# Patient Record
Sex: Female | Born: 1950 | Race: White | Hispanic: No | Smoking: Never smoker
Health system: Southern US, Community
[De-identification: ages and names within clinical notes are randomized; demographics above are authoritative.]

## PROBLEM LIST (undated history)

## (undated) DIAGNOSIS — Z860100 Personal history of colon polyps, unspecified: Secondary | ICD-10-CM

## (undated) DIAGNOSIS — I10 Essential (primary) hypertension: Secondary | ICD-10-CM

## (undated) DIAGNOSIS — L4050 Arthropathic psoriasis, unspecified: Secondary | ICD-10-CM

## (undated) DIAGNOSIS — K219 Gastro-esophageal reflux disease without esophagitis: Secondary | ICD-10-CM

## (undated) DIAGNOSIS — L0390 Cellulitis, unspecified: Secondary | ICD-10-CM

## (undated) DIAGNOSIS — J45909 Unspecified asthma, uncomplicated: Secondary | ICD-10-CM

## (undated) DIAGNOSIS — K8020 Calculus of gallbladder without cholecystitis without obstruction: Secondary | ICD-10-CM

## (undated) DIAGNOSIS — E669 Obesity, unspecified: Secondary | ICD-10-CM

## (undated) DIAGNOSIS — K922 Gastrointestinal hemorrhage, unspecified: Secondary | ICD-10-CM

## (undated) DIAGNOSIS — E119 Type 2 diabetes mellitus without complications: Secondary | ICD-10-CM

## (undated) DIAGNOSIS — N200 Calculus of kidney: Secondary | ICD-10-CM

## (undated) DIAGNOSIS — Z9889 Other specified postprocedural states: Secondary | ICD-10-CM

## (undated) DIAGNOSIS — K7581 Nonalcoholic steatohepatitis (NASH): Secondary | ICD-10-CM

## (undated) HISTORY — DX: Essential (primary) hypertension: I10

## (undated) HISTORY — DX: Type 2 diabetes mellitus without complications: E11.9

## (undated) HISTORY — DX: Obesity, unspecified: E66.9

## (undated) HISTORY — PX: CARDIAC CATHETERIZATION: SHX172

## (undated) HISTORY — PX: KIDNEY STONE SURGERY: SHX686

## (undated) HISTORY — DX: Unspecified asthma, uncomplicated: J45.909

## (undated) HISTORY — DX: Arthropathic psoriasis, unspecified: L40.50

## (undated) HISTORY — DX: Calculus of kidney: N20.0

## (undated) HISTORY — PX: THYROGLOSSAL DUCT CYST: SHX297

## (undated) HISTORY — DX: Cellulitis, unspecified: L03.90

## (undated) HISTORY — DX: Other specified postprocedural states: Z98.890

## (undated) HISTORY — PX: COLONOSCOPY: SHX174

## (undated) HISTORY — DX: Gastrointestinal hemorrhage, unspecified: K92.2

## (undated) HISTORY — DX: Calculus of gallbladder without cholecystitis without obstruction: K80.20

## (undated) HISTORY — PX: CHOLECYSTECTOMY: SHX55

## (undated) HISTORY — PX: TONSILLECTOMY: SUR1361

## (undated) HISTORY — DX: Personal history of colonic polyps: Z86.010

## (undated) HISTORY — DX: Nonalcoholic steatohepatitis (NASH): K75.81

## (undated) HISTORY — DX: Personal history of colon polyps, unspecified: Z86.0100

## (undated) HISTORY — PX: BREAST LUMPECTOMY: SHX2

## (undated) HISTORY — PX: ABDOMINAL HYSTERECTOMY: SHX81

## (undated) HISTORY — DX: Gastro-esophageal reflux disease without esophagitis: K21.9

## (undated) HISTORY — PX: KNEE ARTHROSCOPY: SUR90

---

## 1988-07-18 HISTORY — PX: BREAST EXCISIONAL BIOPSY: SUR124

## 1998-05-05 ENCOUNTER — Ambulatory Visit (HOSPITAL_COMMUNITY): Admission: RE | Admit: 1998-05-05 | Discharge: 1998-05-05 | Payer: Self-pay | Admitting: Endocrinology

## 1999-06-16 ENCOUNTER — Encounter: Payer: Self-pay | Admitting: Family Medicine

## 1999-06-16 ENCOUNTER — Encounter: Admission: RE | Admit: 1999-06-16 | Discharge: 1999-06-16 | Payer: Self-pay | Admitting: Family Medicine

## 2000-09-22 ENCOUNTER — Other Ambulatory Visit: Admission: RE | Admit: 2000-09-22 | Discharge: 2000-09-22 | Payer: Self-pay | Admitting: Surgery

## 2001-08-15 ENCOUNTER — Encounter: Payer: Self-pay | Admitting: Interventional Cardiology

## 2001-08-15 ENCOUNTER — Encounter: Admission: RE | Admit: 2001-08-15 | Discharge: 2001-08-15 | Payer: Self-pay | Admitting: Interventional Cardiology

## 2001-08-16 ENCOUNTER — Ambulatory Visit (HOSPITAL_COMMUNITY): Admission: RE | Admit: 2001-08-16 | Discharge: 2001-08-16 | Payer: Self-pay | Admitting: Interventional Cardiology

## 2003-10-16 ENCOUNTER — Other Ambulatory Visit: Admission: RE | Admit: 2003-10-16 | Discharge: 2003-10-16 | Payer: Self-pay | Admitting: Obstetrics and Gynecology

## 2007-04-06 ENCOUNTER — Encounter (INDEPENDENT_AMBULATORY_CARE_PROVIDER_SITE_OTHER): Payer: Self-pay | Admitting: Surgery

## 2007-04-06 ENCOUNTER — Ambulatory Visit (HOSPITAL_COMMUNITY): Admission: RE | Admit: 2007-04-06 | Discharge: 2007-04-06 | Payer: Self-pay | Admitting: Surgery

## 2008-01-07 ENCOUNTER — Encounter: Admission: RE | Admit: 2008-01-07 | Discharge: 2008-01-07 | Payer: Self-pay | Admitting: Rheumatology

## 2008-01-13 ENCOUNTER — Emergency Department (HOSPITAL_COMMUNITY): Admission: EM | Admit: 2008-01-13 | Discharge: 2008-01-14 | Payer: Self-pay | Admitting: Emergency Medicine

## 2008-02-27 ENCOUNTER — Encounter: Admission: RE | Admit: 2008-02-27 | Discharge: 2008-02-27 | Payer: Self-pay | Admitting: Gastroenterology

## 2008-12-17 ENCOUNTER — Ambulatory Visit (HOSPITAL_BASED_OUTPATIENT_CLINIC_OR_DEPARTMENT_OTHER): Admission: RE | Admit: 2008-12-17 | Discharge: 2008-12-17 | Payer: Self-pay | Admitting: Orthopedic Surgery

## 2009-04-19 DIAGNOSIS — E785 Hyperlipidemia, unspecified: Secondary | ICD-10-CM | POA: Insufficient documentation

## 2009-04-19 DIAGNOSIS — I2510 Atherosclerotic heart disease of native coronary artery without angina pectoris: Secondary | ICD-10-CM | POA: Insufficient documentation

## 2009-04-19 DIAGNOSIS — E042 Nontoxic multinodular goiter: Secondary | ICD-10-CM | POA: Insufficient documentation

## 2009-04-19 DIAGNOSIS — K635 Polyp of colon: Secondary | ICD-10-CM | POA: Insufficient documentation

## 2009-04-19 DIAGNOSIS — L4050 Arthropathic psoriasis, unspecified: Secondary | ICD-10-CM | POA: Insufficient documentation

## 2009-04-19 DIAGNOSIS — J45909 Unspecified asthma, uncomplicated: Secondary | ICD-10-CM | POA: Insufficient documentation

## 2009-04-19 DIAGNOSIS — F320 Major depressive disorder, single episode, mild: Secondary | ICD-10-CM | POA: Insufficient documentation

## 2009-07-03 ENCOUNTER — Encounter: Admission: RE | Admit: 2009-07-03 | Discharge: 2009-07-03 | Payer: Self-pay | Admitting: Obstetrics and Gynecology

## 2009-11-03 ENCOUNTER — Other Ambulatory Visit: Admission: RE | Admit: 2009-11-03 | Discharge: 2009-11-03 | Payer: Self-pay | Admitting: Obstetrics and Gynecology

## 2009-11-05 ENCOUNTER — Encounter: Admission: RE | Admit: 2009-11-05 | Discharge: 2009-11-05 | Payer: Self-pay | Admitting: Surgery

## 2010-02-02 DIAGNOSIS — E559 Vitamin D deficiency, unspecified: Secondary | ICD-10-CM | POA: Insufficient documentation

## 2010-03-04 ENCOUNTER — Encounter
Admission: RE | Admit: 2010-03-04 | Discharge: 2010-04-15 | Payer: Self-pay | Source: Home / Self Care | Admitting: Surgery

## 2010-08-08 ENCOUNTER — Encounter: Payer: Self-pay | Admitting: Surgery

## 2010-10-25 LAB — POCT I-STAT 4, (NA,K, GLUC, HGB,HCT)
HCT: 42 % (ref 36.0–46.0)
Hemoglobin: 14.3 g/dL (ref 12.0–15.0)

## 2010-10-26 DIAGNOSIS — M109 Gout, unspecified: Secondary | ICD-10-CM | POA: Insufficient documentation

## 2010-10-28 ENCOUNTER — Other Ambulatory Visit: Payer: Self-pay | Admitting: Endocrinology

## 2010-10-28 DIAGNOSIS — Z1231 Encounter for screening mammogram for malignant neoplasm of breast: Secondary | ICD-10-CM

## 2010-11-15 ENCOUNTER — Ambulatory Visit
Admission: RE | Admit: 2010-11-15 | Discharge: 2010-11-15 | Disposition: A | Discharge status: Home / Self Care | Payer: PRIVATE HEALTH INSURANCE | Source: Ambulatory Visit | Attending: Endocrinology | Admitting: Endocrinology

## 2010-11-15 DIAGNOSIS — Z1231 Encounter for screening mammogram for malignant neoplasm of breast: Secondary | ICD-10-CM

## 2010-11-30 NOTE — Op Note (Signed)
Connie Shelton, NAVEDO NO.:  [id number]   MEDICAL RECORD NO.:  [id number]          PATIENT TYPE:  AMB   LOCATION:  [facility]                         FACILITY:  [facility]   PHYSICIAN:  Ollen Gross, M.D.    DATE OF BIRTH:  1950-07-30   DATE OF PROCEDURE:  12/17/2008  DATE OF DISCHARGE:                               OPERATIVE REPORT   PREOPERATIVE DIAGNOSIS:  Left knee chondral defect and meniscal tear.   POSTOPERATIVE DIAGNOSIS:  Left knee chondral defect plus loose bodies.   PROCEDURE:  Left knee arthroscopy with chondroplasty medial and lateral  and removal of several large loose bodies.   SURGEON:  Ollen Gross, MD   ASSISTANT.:  None.   ANESTHESIA:  General.   ESTIMATED BLOOD LOSS:  Minimal.   DRAINS:  None.   COMPLICATIONS:  None.   CONDITION:  Stable to recovery.   BRIEF CLINICAL NOTE:  Connie Shelton is a 60 year old female who had an on-the-  job injury over a month ago, injuring her left knee.  She has had  significant pain and mechanical symptoms.  She had a preexisting  arthritis but the condition has worsened with regards to her symptoms.  She presents now for arthroscopy and debridement.   PROCEDURE IN DETAIL:  After successful initiation of general anesthetic,  tourniquet was placed on the left thigh and left lower extremity prepped  and draped in the usual sterile fashion.  Standard superomedial and  inferolateral incisions were made.  Inflow cannula passed superomedial  and camera passed inferolateral.  Arthroscopic visualization proceeds.  Undersurface of patella and trochlea had some grade 2 changes.  There is  1 unstable cartilage lesion on the trochlea which is a very small lesion  about 5 x 5 mm.  The suprapatellar area shows some synovitis but no  loose bodies.  In the lateral gutter, there was a large loose body.  Medial gutter is visualized.  There are no loose bodies there.  Flexion  and valgus force applied to the knee and the medial  compartment is  entered.  There is evidence of a large chondral defect on the medial  femoral condyle about 2 x 3 cm.  The meniscus is intact.  A spinal  needle was used to localize the inferomedial portal, small incision  made, dilator placed and then we were able to get that loose body from  the lateral gutter and get it to pass down to the intercondylar area.  It stayed there while I addressed the medial compartment.  A shaver was  used to debride the chondral defect down to a stable bony base with  stable cartilaginous edges.  Then I abraded the bone to get to a  bleeding surface for hopeful chondral regeneration for fibrocartilage.  The rest of the medial compartment looked okay.  We then addressed the  intercondylar notch.  I had to make the incision a little larger on the  inferomedial portal and then placed a grasping pituitary through that  portal and grabbed the loose body.  I was able to pull it through and  exit  through the portal.  The size was about 1 x 1.5 cm.  This was a  very large cartilaginous loose body.  I then visualized the  intercondylar notch and the ACL was normal.  The lateral compartment was  entered and there was about a 5 x 5-mm osteochondral defect on the  lateral tibial plateau.  There was some degeneration on the lateral  femoral condyle which looked more chronic.  I removed the osteochondral  loose piece with pituitary grasper and then debrided that back down to a  stable base with the shaver.  The lateral meniscus looked okay.  I again  looked throughout the entire joint.  There were no other loose bodies  noted.  I debrided that unstable cartilage lesion on the trochlea.  We  then used the ArthroCare to debride some of the hypertrophic synovium  through the joint.  I again inspected, and no other loose bodies, tears  or defects noted.  The arthroscopic equipment was then removed from the  inferior portals, which were closed with interrupted 4-0 nylon; 20  cc of  0.25% Marcaine with epi are injected through the inflow cannula, and  that is removed, and that portal closed with nylon.  A bulky sterile  dressing is applied.  She is awakened and transported to recovery in  stable condition.      Ollen Gross, M.D.  Electronically Signed     FA/MEDQ  D:  12/17/2008  T:  12/18/2008  Job:  [id number]

## 2010-11-30 NOTE — Op Note (Signed)
Connie Shelton, GRAFFIUS                 ACCOUNT NO.:  [id number]   MEDICAL RECORD NO.:  [id number]          PATIENT TYPE:  AMB   LOCATION:  DAY                          FACILITY:  [facility]   PHYSICIAN:  Thornton Park. Daphine Deutscher, MD  DATE OF BIRTH:  February 04, 1951   DATE OF PROCEDURE:  04/06/2007  DATE OF DISCHARGE:                               OPERATIVE REPORT   PREOPERATIVE DIAGNOSES:  1. Morbid obesity, body mass index 44  2. Chronic cholecystitis.  Marland Kitchen   POSTOPERATIVE DIAGNOSES:  1. Morbid obesity.  2. Chronic cholecystitis.  3. Nonalcoholic steatohepatitis with marked hepatic enlargement.   PROCEDURE:  Laparoscopic cholecystectomy with intraoperative  cholangiogram and liver biopsy; this was in nonstandard fashion from the  fundus down and because of the marked intrahepatic nature of the  gallbladder and also the fact that she had a markedly fatty-infiltrated  liver.   SURGEON:  Thornton Park. Daphine Deutscher, MD   ASSISTANT:  Lennie Muckle, MD   DESCRIPTION OF PROCEDURE:  Connie Shelton was taken to room #1 on April 06, 2007 and given general anesthesia.  The abdomen was prepped with  Techni-Care and draped sterilely.  I made a longitudinal incision  initially down into her umbilicus, where she had had a previous incision  for a laparoscopy.  Because of her size and I did not feel comfortable  with the way this was going into the fascia, I went ahead and changed my  approach and went up to her right upper quadrant and put a 5-mm OptiVu  in using a 0-degree scope.  This entered without difficulty and then I  inflated the abdomen and then inserted the scope and saw adhesions all  along the midline.  A second 5-mm was placed on the left side and these  adhesions were taken down with sharp dissection.  I then decided to put  a 10/11 up from the umbilicus instead of going down that low and I do  and then we converted to a 30-degree 10-mm scope.  Another 10 mm placed  in the upper midabdomen in the  midline.   The gallbladder was grasped with some difficulty; it was found to be  markedly intrahepatic and the liver was so steatohepatitis that it was  very difficult to retract or even lift up.  The angled scope, I spent a  long time just trying to dissect free Calot's triangle and then  basically we made a very deliberate effort.  The duodenum was stuck up  to the infundibulum and after some degree of dissection and after not  making any better progress in exposing that area, I elected to go ahead  and take it from the fundus down.  I elevated the liver with a Glassman  and then incised it with a hook.  I was able to get the gallbladder down  to the neck without entering it.  I then incised it after putting a clip  upon it and did a dynamic cholangiogram, using the balloon catheter to  hold it in place and clip.  Cholangiogram showed a very scanty little  common bile duct, a long cystic duct with good intrahepatic filling and  free flow in the duodenum.  The long cystic duct was then triple-clipped  and divided and the gallbladder was removed.  No bleeding or bile leaks  were noted from the gallbladder bed.  This area was irrigated and  reinspected.  It was placed in a bag and brought through the upper  abdominal trocar.  No other bleeding or abnormalities were noted and the  port sites were injected with Marcaine again, the abdomen was deflated  and the incisions were all closed with 4-0 Vicryl with Benzoin and Steri-  Strips.  The patient seemed to tolerate the procedure well.  Prior to  closure, I did take 3 little core  liver biopsies, again because of her apparent NASH, and these were not  bleeding as well.  The patient tolerated the procedure well and was  taken to the recovery room.  She will be given Tylox to take if needed  for pain and will be followed up the office in about 3 weeks.      Thornton Park Daphine Deutscher, MD  Electronically Signed     MBM/MEDQ  D:  04/06/2007  T:   04/07/2007  Job:  [id number]   cc:   Tera Mater. Evlyn Kanner, M.D.  Fax: [phone]

## 2010-12-03 NOTE — Cardiovascular Report (Signed)
[place]  Patient:    Connie Shelton, Connie Shelton Visit Number: [id number] MRN: [id number]          Service Type: CAT Location: [facility] 2852 01 Attending Physician:  Lyn Records. Iii Dictated by:   Darci Needle, M.D. Proc. Date: 08/16/01 Admit Date:  08/16/2001   CC:         Jeannett Senior A. Evlyn Kanner, M.D.  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  She has had previous cardiac catheterizations that have not demonstrated significant obstructive disease.  This study is being done in the midst of increasing anginal symptoms responsive to nitroglycerin.  PROCEDURES PERFORMED: 1. Left heart catheterization. 2. Selective coronary angiography. 3. Left ventriculography.  DESCRIPTION OF PROCEDURE:  After informed consent, a 6-French sheath was inserted into the right femoral artery.  The patient received 4 mg of IV Versed in 2 mg aliquots to achieve conscious sedation.  Angiography was then performed initially using an A2 6-French multipurpose catheter.  Left ventriculography and hemodynamic recordings were made with this catheter and right coronary angiography was also performed using the multipurpose.  A 6-French #2 left Judkins catheter was used for left coronary angiography. Intracoronary nitroglycerin 100 mg was administered into the left coronary without complications.  RESULTS:    I. HEMODYNAMIC DATA:          a. Aortic pressure 128/71.          b. Left ventricular pressure 137/17.   II. LEFT VENTRICULOGRAPHY:  The left ventricle cavity size is normal.      Contractility is normal.  Ejection fraction is 65%.  III. SELECTIVE CORONARY ANGIOGRAPHY:          a. Left main coronary artery:  Long and free of any obstruction.          b. Left anterior descending coronary:   The LAD is large and reaches             the left ventricular apex.  It gives origin to two large diagonal             branches.  Distal to the second diagonal, there is an eccentric             plaque obstructing the vessel by up to 25%.  No significant             obstruction is noted in the LAD.  More minimal intimal             irregularities are noted in the mid vessel between the first and             second diagonals.          c. Circumflex:  The circumflex is relatively small in its             distribution.  The first and second obtuse marginals are small.             The third obtuse marginal trifurcates on the inferolateral wall.             No significant obstruction is noted.          d. Right coronary artery:  The right coronary artery is large, free             of intimal plaquing, and supplies the  inferior and the lateral             wall.  CONCLUSIONS: 1. Essentially normal coronary arteries.  There are intimal irregularities    noted in the mid left anterior descending artery and a 25% plaque distal to    the second diagonal in the left anterior descending artery.  Irregularities    are also noted in the circumflex.  No significant obstructive lesions are    seen. 2. Normal left ventricular function.  RECOMMENDATIONS:  Aggressive risk factor modification.  Certainly no opportunity for intervention.  Consider checking high sensitivity C-reactive protein. Dictated by:   Darci Needle, M.D. Attending Physician:  Lyn Records. Iii DD:  08/16/01 TD:  08/16/01 Job: [id number]

## 2011-03-09 DIAGNOSIS — E669 Obesity, unspecified: Secondary | ICD-10-CM

## 2011-03-23 DIAGNOSIS — E669 Obesity, unspecified: Secondary | ICD-10-CM

## 2011-03-30 DIAGNOSIS — E669 Obesity, unspecified: Secondary | ICD-10-CM

## 2011-04-14 LAB — POCT I-STAT, CHEM 8
BUN: 8
Chloride: 103
Potassium: 4
Sodium: 136
TCO2: 25

## 2011-04-14 LAB — DIFFERENTIAL
Lymphocytes Relative: 30
Lymphs Abs: 3.1
Monocytes Absolute: 0.6
Monocytes Relative: 5
Neutro Abs: 6.8
Neutrophils Relative %: 65

## 2011-04-14 LAB — CBC
Hemoglobin: 14.5
RBC: 4.62
WBC: 10.5

## 2011-04-14 LAB — POCT CARDIAC MARKERS
CKMB, poc: 1.1
Myoglobin, poc: 40.2
Operator id: 277751

## 2011-04-14 LAB — PROTIME-INR: INR: 0.9

## 2011-04-29 LAB — BASIC METABOLIC PANEL
BUN: 12
CO2: 26
Calcium: 9.1
Chloride: 104
Creatinine, Ser: 0.58

## 2011-04-29 LAB — URINALYSIS, ROUTINE W REFLEX MICROSCOPIC
Glucose, UA: NEGATIVE
Ketones, ur: NEGATIVE
Protein, ur: NEGATIVE
Urobilinogen, UA: 0.2

## 2011-04-29 LAB — HEMOGLOBIN AND HEMATOCRIT, BLOOD: Hemoglobin: 13.7

## 2011-07-04 DIAGNOSIS — E113599 Type 2 diabetes mellitus with proliferative diabetic retinopathy without macular edema, unspecified eye: Secondary | ICD-10-CM | POA: Insufficient documentation

## 2012-05-21 ENCOUNTER — Other Ambulatory Visit: Payer: Self-pay | Admitting: Endocrinology

## 2012-05-21 DIAGNOSIS — Z1231 Encounter for screening mammogram for malignant neoplasm of breast: Secondary | ICD-10-CM

## 2012-06-28 ENCOUNTER — Ambulatory Visit: Payer: PRIVATE HEALTH INSURANCE

## 2012-07-31 ENCOUNTER — Ambulatory Visit
Admission: RE | Admit: 2012-07-31 | Discharge: 2012-07-31 | Disposition: A | Discharge status: Home / Self Care | Payer: Self-pay | Source: Ambulatory Visit | Attending: Endocrinology | Admitting: Endocrinology

## 2012-07-31 DIAGNOSIS — Z1231 Encounter for screening mammogram for malignant neoplasm of breast: Secondary | ICD-10-CM

## 2012-08-08 ENCOUNTER — Other Ambulatory Visit: Payer: Self-pay | Admitting: Endocrinology

## 2012-08-08 DIAGNOSIS — R928 Other abnormal and inconclusive findings on diagnostic imaging of breast: Secondary | ICD-10-CM

## 2012-08-20 ENCOUNTER — Ambulatory Visit
Admission: RE | Admit: 2012-08-20 | Discharge: 2012-08-20 | Disposition: A | Discharge status: Home / Self Care | Payer: BC Managed Care – PPO | Source: Ambulatory Visit | Attending: Endocrinology | Admitting: Endocrinology

## 2012-08-20 DIAGNOSIS — R928 Other abnormal and inconclusive findings on diagnostic imaging of breast: Secondary | ICD-10-CM

## 2013-02-18 ENCOUNTER — Other Ambulatory Visit: Payer: Self-pay | Admitting: Endocrinology

## 2013-02-18 DIAGNOSIS — N6310 Unspecified lump in the right breast, unspecified quadrant: Secondary | ICD-10-CM

## 2013-03-04 ENCOUNTER — Ambulatory Visit
Admission: RE | Admit: 2013-03-04 | Discharge: 2013-03-04 | Disposition: A | Discharge status: Home / Self Care | Payer: BC Managed Care – PPO | Source: Ambulatory Visit | Attending: Endocrinology | Admitting: Endocrinology

## 2013-03-04 DIAGNOSIS — N6310 Unspecified lump in the right breast, unspecified quadrant: Secondary | ICD-10-CM

## 2013-03-12 ENCOUNTER — Ambulatory Visit
Admission: RE | Admit: 2013-03-12 | Discharge: 2013-03-12 | Disposition: A | Discharge status: Home / Self Care | Payer: BC Managed Care – PPO | Source: Ambulatory Visit | Attending: Dermatology | Admitting: Dermatology

## 2013-03-12 ENCOUNTER — Other Ambulatory Visit: Payer: Self-pay | Admitting: Dermatology

## 2013-03-12 DIAGNOSIS — M79604 Pain in right leg: Secondary | ICD-10-CM

## 2013-10-15 ENCOUNTER — Other Ambulatory Visit: Payer: Self-pay | Admitting: Endocrinology

## 2013-10-15 DIAGNOSIS — N63 Unspecified lump in unspecified breast: Secondary | ICD-10-CM

## 2013-10-22 ENCOUNTER — Ambulatory Visit
Admission: RE | Admit: 2013-10-22 | Discharge: 2013-10-22 | Disposition: A | Discharge status: Home / Self Care | Payer: Self-pay | Source: Ambulatory Visit | Attending: Endocrinology | Admitting: Endocrinology

## 2013-10-22 DIAGNOSIS — N63 Unspecified lump in unspecified breast: Secondary | ICD-10-CM

## 2014-01-06 ENCOUNTER — Ambulatory Visit: Payer: BC Managed Care – PPO | Admitting: Interventional Cardiology

## 2014-01-13 ENCOUNTER — Encounter: Payer: Self-pay | Admitting: *Deleted

## 2014-02-18 ENCOUNTER — Ambulatory Visit: Payer: BC Managed Care – PPO | Admitting: Interventional Cardiology

## 2014-04-24 ENCOUNTER — Ambulatory Visit: Payer: BC Managed Care – PPO | Admitting: Interventional Cardiology

## 2014-05-30 ENCOUNTER — Encounter: Payer: Self-pay | Admitting: Interventional Cardiology

## 2014-06-27 ENCOUNTER — Ambulatory Visit: Payer: BC Managed Care – PPO | Admitting: Interventional Cardiology

## 2014-08-07 ENCOUNTER — Ambulatory Visit: Payer: BC Managed Care – PPO | Admitting: Interventional Cardiology

## 2014-11-11 ENCOUNTER — Ambulatory Visit: Payer: Self-pay | Admitting: Interventional Cardiology

## 2014-12-16 ENCOUNTER — Other Ambulatory Visit: Payer: Self-pay | Admitting: Endocrinology

## 2014-12-16 DIAGNOSIS — N6310 Unspecified lump in the right breast, unspecified quadrant: Secondary | ICD-10-CM

## 2014-12-24 ENCOUNTER — Ambulatory Visit
Admission: RE | Admit: 2014-12-24 | Discharge: 2014-12-24 | Disposition: A | Discharge status: Home / Self Care | Payer: BLUE CROSS/BLUE SHIELD | Source: Ambulatory Visit | Attending: Endocrinology | Admitting: Endocrinology

## 2014-12-24 ENCOUNTER — Other Ambulatory Visit: Payer: Self-pay

## 2014-12-24 DIAGNOSIS — N6310 Unspecified lump in the right breast, unspecified quadrant: Secondary | ICD-10-CM

## 2015-06-05 DIAGNOSIS — G43909 Migraine, unspecified, not intractable, without status migrainosus: Secondary | ICD-10-CM | POA: Insufficient documentation

## 2016-01-16 ENCOUNTER — Emergency Department (INDEPENDENT_AMBULATORY_CARE_PROVIDER_SITE_OTHER)
Admission: EM | Admit: 2016-01-16 | Discharge: 2016-01-16 | Disposition: A | Discharge status: Home / Self Care | Payer: No Typology Code available for payment source | Source: Home / Self Care | Attending: Family Medicine | Admitting: Family Medicine

## 2016-01-16 ENCOUNTER — Encounter: Payer: Self-pay | Admitting: Emergency Medicine

## 2016-01-16 DIAGNOSIS — K611 Rectal abscess: Secondary | ICD-10-CM

## 2016-01-16 MED ORDER — DOXYCYCLINE HYCLATE 100 MG PO CAPS: 100.0000 mg | ORAL_CAPSULE | Freq: Two times a day (BID) | ORAL | Status: DC

## 2016-01-16 MED ORDER — TRAMADOL HCL 50 MG PO TABS: 50.0000 mg | ORAL_TABLET | Freq: Four times a day (QID) | ORAL | Status: DC | PRN

## 2016-01-16 NOTE — Discharge Instructions (Signed)
Tramadol is strong pain medication. While taking, do not drink alcohol, drive, or perform any other activities that requires focus while taking these medications.  Please take antibiotics as prescribed and be sure to complete entire course even if you start to feel better to ensure infection does not come back.   Perirectal Abscess An abscess is an infected area that contains a collection of pus. A perirectal abscess is an abscess that is near the opening of the anus or around the rectum. A perirectal abscess can cause a lot of pain, especially during bowel movements. CAUSES This condition is almost always caused by an infection that starts in an anal gland. RISK FACTORS This condition is more likely to develop in:  People with diabetes or inflammatory bowel disease.  People whose body defense system (immune system) is weak.  People who have anal sex.  People who have a sexually transmitted disease (STD).  People who have certain kinds of cancers, such as rectal carcinoma, leukemia, or lymphoma. SYMPTOMS The main symptom of this condition is pain. The pain may be a throbbing pain that gets worse during bowel movements. Other symptoms include:  Fever.  Swelling.  Redness.  Bleeding.  Constipation. DIAGNOSIS The condition is diagnosed with a physical exam. If the abscess is not visible, a health care provider may need to place a finger inside the rectum to find the abscess. Sometimes, imaging tests are done to determine the size and location of the abscess. These tests may include:  An ultrasound.  An MRI.  A CT scan. TREATMENT This condition is usually treated with incision and drainage surgery. Incision and drainage surgery involves making an incision over the abscess to drain the pus. Treatment may also involve antibiotic medicine, pain medicine, stool softeners, or laxatives. HOME CARE INSTRUCTIONS  Take medicines only as directed by your health care provider.  If you  were prescribed an antibiotic, finish all of it even if you start to feel better.  To relieve pain, try sitting:  In a warm, shallow bath (sitz bath).  On a heating pad with the setting on low.  On an inflatable donut-shaped cushion.  Follow any diet instructions as directed by your health care provider.  Keep all follow-up visits as directed by your health care provider. This is important. SEEK MEDICAL CARE IF:  Your abscess is bleeding.  You have pain, swelling, or redness that is getting worse.  You are constipated.  You feel ill.  You have muscle aches or chills.  You have a fever.  Your symptoms return after the abscess has healed.   This information is not intended to replace advice given to you by your health care provider. Make sure you discuss any questions you have with your health care provider.   Document Released: 07/01/2000 Document Revised: 03/25/2015 Document Reviewed: 05/14/2014 Elsevier Interactive Patient Education [employer]2016 Elsevier Inc.

## 2016-01-16 NOTE — ED Notes (Signed)
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

## 2016-01-16 NOTE — ED Provider Notes (Signed)
CSN: [id number]     Arrival date & time 01/16/16  1035 History   First MD Initiated Contact with Patient 01/16/16 1038     Chief Complaint  Patient presents with  . Rectal Pain   (Consider location/radiation/quality/duration/timing/severity/associated sxs/prior Treatment) HPI  Connie Shelton is a 65 y.o. female presenting to UC with c/o gradually worsening painful nodule near the opening of her rectum for 3 days.  Pt reports hx of peri-rectal abscesses in the past that needed I&D but last one was about 8 years ago.  She has been doing warm soaks trying to have it drain itself but no relief. Pain is aching and sore, worse with sitting and ambulation. Pain is 8/10. She has been taking Tylenol with some relief. Denies fever, chills, n/v/d.    Past Medical History  Diagnosis Date  . Steatohepatitis   . History of liver biopsy   . Kidney stone   . Cholelithiasis   . Asthma   . Diabetes mellitus (HCC)   . HTN (hypertension)   . Esophageal reflux   . Hx of colonic polyps   . Psoriatic arthritis (HCC)   . Obesity   . Cellulitis    Past Surgical History  Procedure Laterality Date  . Cholecystectomy    . Abdominal hysterectomy    . Breast lumpectomy    . Cardiac catheterization    . Kidney stone surgery      stents  . Knee arthroscopy      bilateral  . Tonsillectomy    . Thyroglossal duct cyst      removed  . Colonoscopy     Family History  Problem Relation Age of Onset  . Gallstones Mother   . Colon polyps Mother   . Ulcers Mother   . Cancer Father     liver and lung  . Colon polyps Brother   . Diabetes Brother   . Cancer Paternal Uncle     colon all 3   Social History  Substance Use Topics  . Smoking status: Never Smoker   . Smokeless tobacco: None  . Alcohol Use: No   OB History    No data available     Review of Systems  Constitutional: Negative for fever and chills.  Gastrointestinal: Positive for rectal pain. Negative for nausea, vomiting, diarrhea,  constipation, blood in stool and anal bleeding.    Allergies  Avelox; Bactrim; and Ivp dye  Home Medications   Prior to Admission medications   Medication Sig Start Date End Date Taking? Authorizing Provider  Canagliflozin (INVOKANA) 300 MG TABS Take by mouth daily.    Historical Provider, MD  Cholecalciferol (VITAMIN D PO) Take 50,000 Units by mouth once a week.    Historical Provider, MD  doxycycline (VIBRAMYCIN) 100 MG capsule Take 1 capsule (100 mg total) by mouth 2 (two) times daily. One po bid x 7 days 01/16/16   Junius FinnerErin O'Malley, PA-C  insulin regular human CONCENTRATED (HUMULIN R) 500 UNIT/ML SOLN injection Inject into the skin 3 (three) times daily with meals.    Historical Provider, MD  traMADol (ULTRAM) 50 MG tablet Take 1 tablet (50 mg total) by mouth every 6 (six) hours as needed for moderate pain or severe pain. 01/16/16   Junius FinnerErin O'Malley, PA-C   Meds Ordered and Administered this Visit  Medications - No data to display  BP 149/69 mmHg  Pulse 98  Temp(Src) 98.5 F (36.9 C) (Oral)  Ht 5\' 8"  (1.727 m)  Wt 299 lb 8  oz (135.852 kg)  BMI 45.55 kg/m2  SpO2 95% No data found.   Physical Exam  Constitutional: She is oriented to person, place, and time. She appears well-developed and well-nourished.  HENT:  Head: Normocephalic and atraumatic.  Eyes: EOM are normal.  Neck: Normal range of motion.  Cardiovascular: Normal rate.   Pulmonary/Chest: Effort normal.  Genitourinary: Rectal exam shows mass and tenderness.  Right side of anus, at edge of rectum, 1-2cm area of erythema, tenderness and fluctuance. No active bleeding or discharge.   Musculoskeletal: Normal range of motion.  Neurological: She is alert and oriented to person, place, and time.  Skin: Skin is warm and dry.  Psychiatric: She has a normal mood and affect. Her behavior is normal.  Nursing note and vitals reviewed.   ED Course  .Marland Kitchen.Incision and Drainage Date/Time: 01/16/2016 11:24 AM Performed by: Junius Finner'MALLEY,  Siegfried Vieth Authorized by: Donna ChristenBEESE, STEPHEN A Consent: Verbal consent obtained. Risks and benefits: risks, benefits and alternatives were discussed Consent given by: patient Patient understanding: patient states understanding of the procedure being performed Patient consent: the patient's understanding of the procedure matches consent given Site marked: the operative site was marked Required items: required blood products, implants, devices, and special equipment available Patient identity confirmed: verbally with patient Type: abscess Body area: anogenital Location details: perianal Anesthesia: local infiltration Local anesthetic: lidocaine 1% with epinephrine Anesthetic total: 1 ml Patient sedated: no Risk factor: underlying major vessel Scalpel size: 11 Incision type: single straight Incision depth: subcutaneous Complexity: simple Drainage: purulent and  bloody Drainage amount: moderate Wound treatment: wound left open Packing material: none Patient tolerance: Patient tolerated the procedure well with no immediate complications   (including critical care time)  Labs Review Labs Reviewed - No data to display  Imaging Review No results found.    MDM   1. Perirectal abscess    Hx and exam c/w perirectal abscess w/o systemic symptoms. I&D successfully performed.  Rx: Doxycycline. Tramadol prescription printed (pt unsure if she will need strong pain medication, will only fill if pain gets severe)  Encouraged to continue sitz baths as no packing needed to be placed but abscess may continue to drain some.  F[facility]u with PCP or [facility] in 3-4 days if not improving, sooner if worsening.      Junius Finnerrin O'Malley, PA-C 01/16/16 1127

## 2016-01-19 ENCOUNTER — Telehealth: Payer: Self-pay | Admitting: *Deleted

## 2016-01-19 NOTE — ED Notes (Signed)
Callback: Pt reports she is much improved, encouraged to complete antibiotic.

## 2017-01-03 ENCOUNTER — Other Ambulatory Visit: Payer: Self-pay | Admitting: Endocrinology

## 2017-01-03 DIAGNOSIS — Z1231 Encounter for screening mammogram for malignant neoplasm of breast: Secondary | ICD-10-CM

## 2017-01-10 ENCOUNTER — Ambulatory Visit
Admission: RE | Admit: 2017-01-10 | Discharge: 2017-01-10 | Disposition: A | Discharge status: Home / Self Care | Payer: No Typology Code available for payment source | Source: Ambulatory Visit | Attending: Endocrinology | Admitting: Endocrinology

## 2017-01-10 DIAGNOSIS — Z1231 Encounter for screening mammogram for malignant neoplasm of breast: Secondary | ICD-10-CM

## 2017-12-13 DIAGNOSIS — R945 Abnormal results of liver function studies: Secondary | ICD-10-CM | POA: Insufficient documentation

## 2017-12-13 DIAGNOSIS — Z794 Long term (current) use of insulin: Secondary | ICD-10-CM | POA: Insufficient documentation

## 2017-12-22 ENCOUNTER — Other Ambulatory Visit: Payer: Self-pay | Admitting: Endocrinology

## 2017-12-22 DIAGNOSIS — Z1231 Encounter for screening mammogram for malignant neoplasm of breast: Secondary | ICD-10-CM

## 2018-01-11 ENCOUNTER — Ambulatory Visit: Payer: No Typology Code available for payment source

## 2018-01-26 ENCOUNTER — Ambulatory Visit: Payer: No Typology Code available for payment source

## 2018-02-16 ENCOUNTER — Ambulatory Visit: Payer: No Typology Code available for payment source | Admitting: Podiatry

## 2018-02-16 ENCOUNTER — Ambulatory Visit: Payer: No Typology Code available for payment source

## 2018-03-02 ENCOUNTER — Ambulatory Visit: Payer: No Typology Code available for payment source | Admitting: Podiatry

## 2018-03-21 ENCOUNTER — Ambulatory Visit
Admission: RE | Admit: 2018-03-21 | Discharge: 2018-03-21 | Disposition: A | Discharge status: Home / Self Care | Payer: PRIVATE HEALTH INSURANCE | Source: Ambulatory Visit | Attending: Endocrinology | Admitting: Endocrinology

## 2018-03-21 DIAGNOSIS — Z1231 Encounter for screening mammogram for malignant neoplasm of breast: Secondary | ICD-10-CM

## 2018-04-13 ENCOUNTER — Ambulatory Visit: Payer: No Typology Code available for payment source | Admitting: Podiatry

## 2018-04-13 ENCOUNTER — Encounter: Payer: Self-pay | Admitting: Podiatry

## 2018-04-13 ENCOUNTER — Ambulatory Visit (INDEPENDENT_AMBULATORY_CARE_PROVIDER_SITE_OTHER): Payer: No Typology Code available for payment source | Admitting: Podiatry

## 2018-04-13 VITALS — BP 164/80 | HR 84

## 2018-04-13 DIAGNOSIS — B351 Tinea unguium: Secondary | ICD-10-CM

## 2018-04-13 DIAGNOSIS — Z794 Long term (current) use of insulin: Secondary | ICD-10-CM | POA: Diagnosis not present

## 2018-04-13 DIAGNOSIS — M79675 Pain in left toe(s): Secondary | ICD-10-CM | POA: Diagnosis not present

## 2018-04-13 DIAGNOSIS — M79674 Pain in right toe(s): Secondary | ICD-10-CM

## 2018-04-13 DIAGNOSIS — E119 Type 2 diabetes mellitus without complications: Secondary | ICD-10-CM

## 2018-04-13 DIAGNOSIS — L84 Corns and callosities: Secondary | ICD-10-CM | POA: Diagnosis not present

## 2018-04-13 NOTE — Patient Instructions (Signed)

## 2018-04-17 NOTE — Progress Notes (Signed)
Subjective: Connie Shelton is a 67 y.o. y.o. female who presents to clinic today for diabetic foot evaluation. She presents today  with  cc of painful, discolored, thick toenails and painful calluses and corns which interfere with daily activities. Pain is aggravated when wearing enclosed shoe gear.  She has had Podiatric care in the past with a local Podiatry practice, but desired to change Providers.  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  Medical History   Date Unknown Asthma  Date Unknown Cellulitis  Date Unknown Cholelithiasis  Date Unknown Diabetes mellitus (HCC)  Date Unknown Esophageal reflux  Date Unknown History of liver biopsy  Date Unknown HTN (hypertension)  Date Unknown Hx of colonic polyps  Date Unknown Kidney stone  Date Unknown Obesity  Date Unknown Psoriatic arthritis (HCC)  Date Unknown Steatohepatitis   Surgical History   Date Unknown Abdominal hysterectomy  Date Unknown Breast lumpectomy  Date Unknown Cardiac catheterization  Date Unknown Cholecystectomy  Date Unknown Colonoscopy  Date Unknown Kidney stone surgery   Date Unknown Knee arthroscopy   Date Unknown Thyroglossal duct cyst   Date Unknown Tonsillectomy   Medications    Cholecalciferol (VITAMIN D PO)    HUMALOG 100 UNIT/ML injection    indomethacin (INDOCIN) 25 MG capsule    insulin regular human CONCENTRATED (HUMULIN R) 500 UNIT/ML SOLN injection    JARDIANCE 25 MG TABS tablet    ramipril (ALTACE) 10 MG capsule    Vitamin D, Ergocalciferol, (DRISDOL) 50000 units CAPS capsule    Canagliflozin (INVOKANA) 300 MG TABS    doxycycline (VIBRAMYCIN) 100 MG capsule    traMADol (ULTRAM) 50 MG tablet    Allergies     Avelox [Moxifloxacin Hcl In Nacl]  Bactrim [Sulfamethoxazole-trimethoprim]  Ivp Dye [Iodinated Diagnostic Agents]   Tobacco History   Smoking Status  Never Smoker  Smokeless Tobacco Status  Never Used   She has not had an alcoholic drink in the  past 5 years. She denies any illicit drug use. She is divorced.  Family History   Mother (Deceased) Gallstones    Colon polyps    Ulcers         Father Cancer          Brother Colon polyps    Diabetes         Paternal Uncle Cancer      ROS: Per HPI unless specifically indicated in ROS section   Objective: Vitals:   04/13/18 0851  BP: (!) 164/80  Pulse: 84   Vascular Examination: Capillary refill time immediate x 10 digits Dorsalis pedis and Posterior tibial pulses present b/l Sparse digital hair x 10 digits Skin temperature gradient normal BLE  Dermatological Examination: Skin with normal turgor, texture and tone b/l Toenails 1-5 b/l discolored, thick, dystrophic with subungual debris and pain with palpation to nailbeds due to thickness of nails. Hyperkeratotic lesion submetatarsal head 5 b/l. Spine callus noted plantar aspect b/l 4th digits  Musculoskeletal: Muscle strength 5/5 to all LE muscle groups DJD medial midfoot b/l Hallux valgus with bunion deformity BLE  Neurological: Sensation diminished with 10 gram monofilament. Vibratory sensation diminished  Assessment: 1. Painful onychomycosis toenails 1-5 b/l 2.  Calluses submetatarsal head 5 b/l and plantar 4th digits b/l 3.  NIDDM  Plan: 1. Discussed diabetic foot care principles. Literature dispensed today. 2. Discussed topical treatment for onychomycosis. She will think about it. Toenails 1-5 b/l were debrided in length and girth without iatrogenic bleeding. 3. Hyperkeratotic  lesions pared x 4  4. Patient to continue soft, supportive shoe gear 5. Patient to report any pedal injuries to medical professional  6. Follow up 3 months.  7. Patient/POA to call should there be a concern in the interim.

## 2018-04-19 ENCOUNTER — Encounter: Payer: Self-pay | Admitting: Podiatry

## 2018-06-27 ENCOUNTER — Ambulatory Visit: Payer: No Typology Code available for payment source | Admitting: Podiatry

## 2018-07-06 ENCOUNTER — Ambulatory Visit: Payer: No Typology Code available for payment source | Admitting: Podiatry

## 2018-08-06 ENCOUNTER — Ambulatory Visit: Payer: No Typology Code available for payment source | Admitting: Podiatry

## 2018-09-03 ENCOUNTER — Ambulatory Visit (INDEPENDENT_AMBULATORY_CARE_PROVIDER_SITE_OTHER): Payer: No Typology Code available for payment source | Admitting: Podiatry

## 2018-09-03 DIAGNOSIS — B351 Tinea unguium: Secondary | ICD-10-CM

## 2018-09-03 DIAGNOSIS — L853 Xerosis cutis: Secondary | ICD-10-CM | POA: Diagnosis not present

## 2018-09-03 DIAGNOSIS — L84 Corns and callosities: Secondary | ICD-10-CM | POA: Diagnosis not present

## 2018-09-03 DIAGNOSIS — B353 Tinea pedis: Secondary | ICD-10-CM | POA: Diagnosis not present

## 2018-09-03 DIAGNOSIS — M79674 Pain in right toe(s): Secondary | ICD-10-CM | POA: Diagnosis not present

## 2018-09-03 DIAGNOSIS — E1142 Type 2 diabetes mellitus with diabetic polyneuropathy: Secondary | ICD-10-CM | POA: Diagnosis not present

## 2018-09-03 DIAGNOSIS — M79675 Pain in left toe(s): Secondary | ICD-10-CM

## 2018-09-03 MED ORDER — AMMONIUM LACTATE 12 % EX LOTN: TOPICAL_LOTION | CUTANEOUS | 3 refills | Status: DC

## 2018-09-03 MED ORDER — KETOCONAZOLE 2 % EX CREA: 1.0000 "application " | TOPICAL_CREAM | Freq: Every day | CUTANEOUS | 1 refills | Status: AC

## 2018-09-03 NOTE — Patient Instructions (Signed)

## 2018-09-05 ENCOUNTER — Encounter: Payer: Self-pay | Admitting: Podiatry

## 2018-09-05 NOTE — Progress Notes (Signed)
Subjective: Howell Rucks presents today for preventative diabetic foot care.  She presents with painful, thick toenails 1-5 b/l that she cannot cut and which interfere with daily activities.  Pain is aggravated when wearing enclosed shoe gear.  Adrian Prince, MD is her PCP.   Current Outpatient Medications:  .  ammonium lactate (AMLACTIN) 12 % lotion, Apply to both feet twice daily, Disp: 400 g, Rfl: 3 .  Cholecalciferol (VITAMIN D PO), Take 50,000 Units by mouth once a week., Disp: , Rfl:  .  DULoxetine (CYMBALTA) 30 MG capsule, Take 30 mg by mouth daily., Disp: , Rfl:  .  HUMALOG 100 UNIT/ML injection, INJECT 10 UNITS IF BS IS ABOVE 250, Disp: , Rfl: 5 .  indomethacin (INDOCIN) 25 MG capsule, TAKE ONE CAPSULE EVERY DAY AS NEEDED, Disp: , Rfl: 5 .  insulin regular human CONCENTRATED (HUMULIN R) 500 UNIT/ML SOLN injection, Inject into the skin 3 (three) times daily with meals., Disp: , Rfl:  .  JARDIANCE 25 MG TABS tablet, Take 25 mg by mouth daily., Disp: , Rfl: 6 .  ketoconazole (NIZORAL) 2 % cream, Apply 1 application topically daily. Apply to both feet and between toes once daily for 6 weeks, Disp: 30 g, Rfl: 1 .  ramipril (ALTACE) 10 MG capsule, Take 10 mg by mouth 2 (two) times daily., Disp: , Rfl: 3 .  torsemide (DEMADEX) 20 MG tablet, TAKE TWO (2) TABLETS BY MOUTH TWICE DAILY, Disp: , Rfl:  .  Vitamin D, Ergocalciferol, (DRISDOL) 50000 units CAPS capsule, TAKE ONE CAPSULE BY MOUTH ONE TIME PER WEEK, Disp: , Rfl: 3  Allergies  Allergen Reactions  . Avelox [Moxifloxacin Hcl In Nacl]     Anaphylactic    . Bactrim [Sulfamethoxazole-Trimethoprim]     Hives   . Ivp Dye [Iodinated Diagnostic Agents]     Hives     Objective:  Vascular Examination: Capillary refill time immediate x 10 digits  Dorsalis pedis and Posterior tibial pulses palpable b/l  Digital hair sparse x 10 digits  Skin temperature gradient WNL b/l  Dermatological Examination: Skin with normal turgor,  texture and tone b/l  Toenails 1-5 b/l discolored, thick, dystrophic with subungual debris and pain with palpation to nailbeds due to thickness of nails.  Hyperkeratotic lesion noted submetatarsal head 5 bilaterally.  Spine callus noted plantar aspect bilateral fourth digits.  No erythema, no edema, no drainage, no flocculence noted.  Diffuse scaling noted peripherally and plantarly b/l feet with mild foot odor.  No interdigital macerations.  No blisters, no weeping. No signs of secondary bacterial infection noted.  Musculoskeletal: Muscle strength 5/5 to all LE muscle groups  DJD medial midfoot bilaterally.  No pain, crepitus or joint limitation noted with ROM.   Neurological: Sensation diminished with 10 gram monofilament. Vibratory sensation diminished  Assessment: Painful onychomycosis toenails 1-5 b/l  Calluses submetatarsal head 5 bilaterally and plantar fourth digits bilaterally NIDDM with neuropathy Tinea pedis bilaterally Xerosis bilaterally  Plan: 1. Toenails 1-5 b/l were debrided in length and girth without iatrogenic bleeding. For tinea pedis, prescription sent to pharmacy for Ketoconazole Cream 2% to be applied to both feet and between toes qd x 6 weeks. For xerosis, prescription for AmLactin lotion 12% was written.  She is to apply to both feet twice a day avoiding application in between the toes. 2. Patient to continue soft, supportive shoe gear 3. Patient to report any pedal injuries to medical professional immediately. 4. Follow up 3 months. Patient/POA to call should there  be a concern in the interim.

## 2018-12-03 ENCOUNTER — Ambulatory Visit: Payer: No Typology Code available for payment source | Admitting: Podiatry

## 2018-12-19 ENCOUNTER — Ambulatory Visit: Payer: No Typology Code available for payment source | Admitting: Podiatry

## 2019-01-08 ENCOUNTER — Ambulatory Visit: Payer: No Typology Code available for payment source | Admitting: Podiatry

## 2019-01-14 DIAGNOSIS — I129 Hypertensive chronic kidney disease with stage 1 through stage 4 chronic kidney disease, or unspecified chronic kidney disease: Secondary | ICD-10-CM | POA: Insufficient documentation

## 2019-05-15 ENCOUNTER — Other Ambulatory Visit: Payer: Self-pay | Admitting: Endocrinology

## 2019-05-15 DIAGNOSIS — Z1231 Encounter for screening mammogram for malignant neoplasm of breast: Secondary | ICD-10-CM

## 2019-06-14 DIAGNOSIS — F419 Anxiety disorder, unspecified: Secondary | ICD-10-CM | POA: Insufficient documentation

## 2019-07-04 ENCOUNTER — Ambulatory Visit: Payer: PRIVATE HEALTH INSURANCE

## 2019-08-04 ENCOUNTER — Other Ambulatory Visit: Payer: Self-pay | Admitting: Podiatry

## 2019-08-04 DIAGNOSIS — L853 Xerosis cutis: Secondary | ICD-10-CM

## 2019-08-21 ENCOUNTER — Ambulatory Visit: Payer: PRIVATE HEALTH INSURANCE

## 2019-09-12 ENCOUNTER — Ambulatory Visit: Payer: PRIVATE HEALTH INSURANCE

## 2019-09-27 ENCOUNTER — Ambulatory Visit: Payer: PRIVATE HEALTH INSURANCE

## 2019-10-16 ENCOUNTER — Ambulatory Visit: Payer: PRIVATE HEALTH INSURANCE

## 2019-11-21 ENCOUNTER — Ambulatory Visit: Payer: PRIVATE HEALTH INSURANCE

## 2020-01-03 DIAGNOSIS — I10 Essential (primary) hypertension: Secondary | ICD-10-CM | POA: Insufficient documentation

## 2020-03-04 ENCOUNTER — Ambulatory Visit: Payer: No Typology Code available for payment source | Admitting: Podiatry

## 2020-06-05 ENCOUNTER — Encounter: Payer: Self-pay | Admitting: Podiatry

## 2020-06-05 ENCOUNTER — Ambulatory Visit (INDEPENDENT_AMBULATORY_CARE_PROVIDER_SITE_OTHER): Payer: No Typology Code available for payment source | Admitting: Podiatry

## 2020-06-05 ENCOUNTER — Other Ambulatory Visit: Payer: Self-pay

## 2020-06-05 DIAGNOSIS — M79674 Pain in right toe(s): Secondary | ICD-10-CM | POA: Diagnosis not present

## 2020-06-05 DIAGNOSIS — L84 Corns and callosities: Secondary | ICD-10-CM

## 2020-06-05 DIAGNOSIS — E1142 Type 2 diabetes mellitus with diabetic polyneuropathy: Secondary | ICD-10-CM | POA: Diagnosis not present

## 2020-06-05 DIAGNOSIS — M79675 Pain in left toe(s): Secondary | ICD-10-CM

## 2020-06-05 DIAGNOSIS — B351 Tinea unguium: Secondary | ICD-10-CM

## 2020-06-07 NOTE — Progress Notes (Signed)
Subjective:  Patient ID: Connie Shelton, female    DOB: April 29, 1951,  MRN: [id number]  69 y.o. female presents with at risk foot care with history of diabetic neuropathy and callus(es) submet head 5 b/l and painful thick toenails that are difficult to trim. Painful toenails interfere with ambulation. Aggravating factors include wearing enclosed shoe gear. Pain is relieved with periodic professional debridement. Painful calluses are aggravated when weightbearing with and without shoegear. Pain is relieved with periodic professional debridement..    She states she has had a couple of falls lately and will be scheduling an appointment with her Endocrinologist for a workup.   She also states she will be retiring soon.  PCP: Adrian Prince, MD.  Review of Systems: Negative except as noted in the HPI.  Past Medical History:  Diagnosis Date  . Asthma   . Cellulitis   . Cholelithiasis   . Diabetes mellitus (HCC)   . Esophageal reflux   . History of liver biopsy   . HTN (hypertension)   . Hx of colonic polyps   . Kidney stone   . Obesity   . Psoriatic arthritis (HCC)   . Steatohepatitis    Past Surgical History:  Procedure Laterality Date  . ABDOMINAL HYSTERECTOMY    . BREAST LUMPECTOMY    . CARDIAC CATHETERIZATION    . CHOLECYSTECTOMY    . COLONOSCOPY    . KIDNEY STONE SURGERY     stents  . KNEE ARTHROSCOPY     bilateral  . THYROGLOSSAL DUCT CYST     removed  . TONSILLECTOMY     There are no problems to display for this patient.   Current Outpatient Medications:  .  ammonium lactate (LAC-HYDRIN) 12 % lotion, APPLY TO BOTH FEET TWICE DAILY, Disp: 400 mL, Rfl: 3 .  B-D INSULIN SYRINGE 1CC/25G 25G X 5/8" 1 ML MISC, Inject into the skin., Disp: , Rfl:  .  buPROPion (WELLBUTRIN) 75 MG tablet, Take 75 mg by mouth at bedtime., Disp: , Rfl:  .  cephALEXin (KEFLEX) 500 MG capsule, Take 500 mg by mouth 2 (two) times daily., Disp: , Rfl:  .  Cholecalciferol (VITAMIN D PO), Take 50,000  Units by mouth once a week., Disp: , Rfl:  .  Cholecalciferol (VITAMIN D3) 1.25 MG (50000 UT) CAPS, Take by mouth., Disp: , Rfl:  .  DULoxetine (CYMBALTA) 30 MG capsule, Take 30 mg by mouth daily., Disp: , Rfl:  .  DULoxetine (CYMBALTA) 60 MG capsule, Take 60 mg by mouth daily., Disp: , Rfl:  .  fluconazole (DIFLUCAN) 150 MG tablet, Take 150 mg by mouth every 3 (three) days., Disp: , Rfl:  .  HUMALOG 100 UNIT/ML injection, INJECT 10 UNITS IF BS IS ABOVE 250, Disp: , Rfl: 5 .  indomethacin (INDOCIN) 25 MG capsule, TAKE ONE CAPSULE EVERY DAY AS NEEDED, Disp: , Rfl: 5 .  insulin regular human CONCENTRATED (HUMULIN R) 500 UNIT/ML SOLN injection, Inject into the skin 3 (three) times daily with meals., Disp: , Rfl:  .  JARDIANCE 25 MG TABS tablet, Take 25 mg by mouth daily., Disp: , Rfl: 6 .  ketoconazole (NIZORAL) 2 % cream, Apply 1 application topically daily. Apply to both feet and between toes once daily for 6 weeks, Disp: 30 g, Rfl: 1 .  metroNIDAZOLE (METROGEL) 1 % gel, Apply 1 application topically daily., Disp: , Rfl:  .  ondansetron (ZOFRAN-ODT) 8 MG disintegrating tablet, Take by mouth at bedtime., Disp: , Rfl:  .  ramipril (  ALTACE) 10 MG capsule, Take 10 mg by mouth 2 (two) times daily., Disp: , Rfl: 3 .  RYBELSUS 14 MG TABS, Take 1 tablet by mouth at bedtime., Disp: , Rfl:  .  torsemide (DEMADEX) 20 MG tablet, TAKE TWO (2) TABLETS BY MOUTH TWICE DAILY, Disp: , Rfl:  .  traMADol (ULTRAM) 50 MG tablet, Take 50 mg by mouth 2 (two) times daily as needed., Disp: , Rfl:  .  ULTICARE TUBERCULIN SAFETY SYR 25G X 5/8" 1 ML MISC, , Disp: , Rfl:  .  Vitamin D, Ergocalciferol, (DRISDOL) 50000 units CAPS capsule, TAKE ONE CAPSULE BY MOUTH ONE TIME PER WEEK, Disp: , Rfl: 3 Allergies  Allergen Reactions  . Avelox [Moxifloxacin Hcl In Nacl]     Anaphylactic    . Bactrim [Sulfamethoxazole-Trimethoprim]     Hives   . Ivp Dye [Iodinated Diagnostic Agents]     Hives    Social History   Tobacco Use   Smoking Status Never Smoker  Smokeless Tobacco Never Used    Objective:  There were no vitals filed for this visit. Constitutional Patient is a pleasant 69 y.o. Caucasian female morbidly obese in NAD. AAO x 3.  Vascular Capillary refill time to digits immediate b/l. Palpable pedal pulses b/l LE. Pedal hair sparse. Lower extremity skin temperature gradient within normal limits. No cyanosis or clubbing noted.  Neurologic Normal speech. Protective sensation diminished with 10g monofilament b/l. Vibratory sensation diminished b/l.  Dermatologic Pedal skin with normal turgor, texture and tone bilaterally. No open wounds bilaterally. No interdigital macerations bilaterally. Toenails 1-5 b/l elongated, discolored, dystrophic, thickened, crumbly with subungual debris and tenderness to dorsal palpation. Hyperkeratotic lesion(s) submet head 5 left foot and submet head 5 right foot.  No erythema, no edema, no drainage, no fluctuance.  Orthopedic: Normal muscle strength 5/5 to all lower extremity muscle groups bilaterally. Limited joint ROM to the left foot and right foot.   No flowsheet data found.     Assessment:   1. Pain due to onychomycosis of toenails of both feet   2. Callus   3. Diabetic peripheral neuropathy associated with type 2 diabetes mellitus (HCC)    Plan:  Patient was evaluated and treated and all questions answered.  Onychomycosis with pain -Nails palliatively debridement as below. -Educated on self-care  Procedure: Nail Debridement Rationale: Pain Type of Debridement: manual, sharp debridement. Instrumentation: Nail nipper, rotary burr. Number of Nails: 10  -Examined patient. -Continue diabetic foot care principles. -Patient to continue soft, supportive shoe gear daily. -Toenails 1-5 b/l were debrided in length and girth with sterile nail nippers and dremel without iatrogenic bleeding.  -Callus(es) submet head 5 left foot and submet head 5 right foot pared utilizing  sterile scalpel blade without complication or incident. Total number debrided =2. -Patient to report any pedal injuries to medical professional immediately. -Patient/POA to call should there be question/concern in the interim.  Return in about 3 months (around 09/05/2020) for diabetic foot care.  Freddie Breech, DPM

## 2020-06-18 ENCOUNTER — Ambulatory Visit
Admission: RE | Admit: 2020-06-18 | Discharge: 2020-06-18 | Disposition: A | Discharge status: Home / Self Care | Payer: PRIVATE HEALTH INSURANCE | Source: Ambulatory Visit | Attending: Endocrinology | Admitting: Endocrinology

## 2020-06-18 ENCOUNTER — Other Ambulatory Visit: Payer: Self-pay

## 2020-06-18 DIAGNOSIS — Z1231 Encounter for screening mammogram for malignant neoplasm of breast: Secondary | ICD-10-CM

## 2020-09-15 ENCOUNTER — Ambulatory Visit: Payer: No Typology Code available for payment source | Admitting: Podiatry

## 2020-09-21 ENCOUNTER — Encounter: Payer: Self-pay | Admitting: Podiatry

## 2020-09-21 ENCOUNTER — Ambulatory Visit: Payer: Medicare Other | Admitting: Podiatry

## 2020-09-21 ENCOUNTER — Other Ambulatory Visit: Payer: Self-pay

## 2020-09-21 DIAGNOSIS — E1142 Type 2 diabetes mellitus with diabetic polyneuropathy: Secondary | ICD-10-CM | POA: Diagnosis not present

## 2020-09-21 DIAGNOSIS — M79674 Pain in right toe(s): Secondary | ICD-10-CM | POA: Diagnosis not present

## 2020-09-21 DIAGNOSIS — L84 Corns and callosities: Secondary | ICD-10-CM | POA: Diagnosis not present

## 2020-09-21 DIAGNOSIS — M79675 Pain in left toe(s): Secondary | ICD-10-CM | POA: Diagnosis not present

## 2020-09-21 DIAGNOSIS — B351 Tinea unguium: Secondary | ICD-10-CM

## 2020-09-21 DIAGNOSIS — E113523 Type 2 diabetes mellitus with proliferative diabetic retinopathy with traction retinal detachment involving the macula, bilateral: Secondary | ICD-10-CM | POA: Insufficient documentation

## 2020-09-21 DIAGNOSIS — N1831 Chronic kidney disease, stage 3a: Secondary | ICD-10-CM | POA: Insufficient documentation

## 2020-09-21 NOTE — Progress Notes (Signed)
Subjective:  Patient ID: Connie Shelton, female    DOB: Oct 25, 1950,  MRN: [id number]  70 y.o. female presents with at risk foot care with history of diabetic neuropathy and callus(es) submet head 5 b/l and painful thick toenails that are difficult to trim. Painful toenails interfere with ambulation. Aggravating factors include wearing enclosed shoe gear. Pain is relieved with periodic professional debridement. Painful calluses are aggravated when weightbearing with and without shoegear. Pain is relieved with periodic professional debridement.   Her blood glucose was 136 mg/dl this morning.  She is happy to report she retired on July 17, 2020. She is enjoying retirement and this allows her time to take care of her parents.   She states she had a fall in her garage in October attempting to put some food in the trunk of her car. She broker her right knee cap and three teeth. Her knee cap has healed. She is still in the process of getting her dental work done.  PCP: Adrian Prince, MD. Last visit was 09/07/2020.  Review of Systems: Negative except as noted in the HPI.   Allergies  Allergen Reactions  . Avelox [Moxifloxacin Hcl In Nacl]     Anaphylactic    . Avelox [Moxifloxacin]     Other reaction(s): anaphalax  . Bactrim [Sulfamethoxazole-Trimethoprim]     Hives   . Empagliflozin     Other reaction(s): recurrent vaginal yeast  . Iodinated Diagnostic Agents     Hives  Other reaction(s): Unknown  . Neosporin Plus Max St     Other reaction(s): Unknown  . Sulfa Antibiotics     Other reaction(s): Unknown   Objective:  There were no vitals filed for this visit. Constitutional Patient is a pleasant 69 y.o. Caucasian female morbidly obese in NAD. AAO x 3.  Vascular Capillary refill time to digits immediate b/l. Palpable pedal pulses b/l LE. Pedal hair sparse. Lower extremity skin temperature gradient within normal limits. No cyanosis or clubbing noted.  Neurologic Normal speech.  Protective sensation diminished with 10g monofilament b/l. Vibratory sensation diminished b/l.  Dermatologic Pedal skin with normal turgor, texture and tone bilaterally. No open wounds bilaterally. No interdigital macerations bilaterally. Toenails 1-5 b/l elongated, discolored, dystrophic, thickened, crumbly with subungual debris and tenderness to dorsal palpation. Hyperkeratotic lesion(s) submet head 5 left foot and submet head 5 right foot.  No erythema, no edema, no drainage, no fluctuance.  Orthopedic: Normal muscle strength 5/5 to all lower extremity muscle groups bilaterally. Limited joint ROM to the left foot and right foot.   Assessment:   1. Pain due to onychomycosis of toenails of both feet   2. Callus   3. Diabetic peripheral neuropathy associated with type 2 diabetes mellitus (HCC)    Plan:  Patient was evaluated and treated and all questions answered.  Onychomycosis with pain -Nails palliatively debridement as below. -Educated on self-care  Procedure: Nail Debridement Rationale: Pain Type of Debridement: manual, sharp debridement. Instrumentation: Nail nipper, rotary burr. Number of Nails: 10  -Examined patient. -Continue diabetic foot care principles. -Patient to continue soft, supportive shoe gear daily. -Toenails 1-5 b/l were debrided in length and girth with sterile nail nippers and dremel without iatrogenic bleeding.  -Callus(es) submet head 5 left foot and submet head 5 right foot pared utilizing sterile scalpel blade without complication or incident. Total number debrided =2. -Patient to report any pedal injuries to medical professional immediately. -Patient/POA to call should there be question/concern in the interim.  Return in about 3 months (around  12/22/2020).  Freddie Breech, DPM

## 2020-12-28 ENCOUNTER — Encounter: Payer: Self-pay | Admitting: Podiatry

## 2020-12-28 ENCOUNTER — Other Ambulatory Visit: Payer: Self-pay

## 2020-12-28 ENCOUNTER — Ambulatory Visit (INDEPENDENT_AMBULATORY_CARE_PROVIDER_SITE_OTHER): Payer: Medicare Other | Admitting: Podiatry

## 2020-12-28 DIAGNOSIS — L84 Corns and callosities: Secondary | ICD-10-CM

## 2020-12-28 DIAGNOSIS — M79675 Pain in left toe(s): Secondary | ICD-10-CM | POA: Diagnosis not present

## 2020-12-28 DIAGNOSIS — M79674 Pain in right toe(s): Secondary | ICD-10-CM | POA: Diagnosis not present

## 2020-12-28 DIAGNOSIS — E1142 Type 2 diabetes mellitus with diabetic polyneuropathy: Secondary | ICD-10-CM

## 2020-12-28 DIAGNOSIS — B351 Tinea unguium: Secondary | ICD-10-CM | POA: Diagnosis not present

## 2021-01-04 NOTE — Progress Notes (Signed)
  Subjective:  Patient ID: Connie Shelton, female    DOB: 1951/06/19,  MRN: [id number]  70 y.o. female presents with at risk foot care with history of diabetic neuropathy and callus(es) b/l feet and painful thick toenails that are difficult to trim. Painful toenails interfere with ambulation. Aggravating factors include wearing enclosed shoe gear. Pain is relieved with periodic professional debridement. Painful calluses are aggravated when weightbearing with and without shoegear. Pain is relieved with periodic professional debridement..    Patient's blood sugar was 97 mg/dl this morning.  PCP: Adrian Prince, MD and last visit was: 09/08/2020.  Review of Systems: Negative except as noted in the HPI.   Allergies  Allergen Reactions   Avelox [Moxifloxacin Hcl In Nacl]     Anaphylactic     Avelox [Moxifloxacin]     Other reaction(s): anaphalax   Bactrim [Sulfamethoxazole-Trimethoprim]     Hives    Empagliflozin     Other reaction(s): recurrent vaginal yeast   Iodinated Diagnostic Agents     Hives  Other reaction(s): Unknown   Neosporin Plus Max St     Other reaction(s): Unknown   Sulfa Antibiotics     Other reaction(s): Unknown    Objective:  There were no vitals filed for this visit. Constitutional Patient is a pleasant 70 y.o. Caucasian female morbidly obese in NAD. AAO x 3.  Vascular Capillary refill time to digits immediate b/l. Palpable pedal pulses b/l LE. Pedal hair sparse. Lower extremity skin temperature gradient within normal limits. No pain with calf compression b/l. No cyanosis or clubbing noted.  Neurologic Normal speech. Protective sensation diminished with 10g monofilament b/l.  Dermatologic Pedal skin with normal turgor, texture and tone bilaterally. No open wounds bilaterally. No interdigital macerations bilaterally. Toenails 1-5 b/l elongated, discolored, dystrophic, thickened, crumbly with subungual debris and tenderness to dorsal palpation. Hyperkeratotic  lesion(s) submet head 5 left foot and submet head 5 right foot.  No erythema, no edema, no drainage, no fluctuance.  Orthopedic: Normal muscle strength 5/5 to all lower extremity muscle groups bilaterally. No pain crepitus or joint limitation noted with ROM b/l. No gross bony deformities bilaterally.    Assessment:   1. Pain due to onychomycosis of toenails of both feet   2. Callus   3. Diabetic peripheral neuropathy associated with type 2 diabetes mellitus (HCC)    Plan:  Patient was evaluated and treated and all questions answered.  Onychomycosis with pain -Nails palliatively debridement as below. -Educated on self-care  Procedure: Nail Debridement Rationale: Pain Type of Debridement: manual, sharp debridement. Instrumentation: Nail nipper, rotary burr. Number of Nails: 10  -Examined patient. -Continue diabetic foot care principles. -Patient to continue soft, supportive shoe gear daily. -Toenails 1-5 b/l were debrided in length and girth with sterile nail nippers and dremel without iatrogenic bleeding.  -Calluses pared submetatarsal head(s) 5 b/l utilizing sterile scalpel blade without incident.  -Patient to report any pedal injuries to medical professional immediately. -Patient/POA to call should there be question/concern in the interim.  Return in about 3 months (around 03/30/2021).  Freddie Breech, DPM

## 2021-02-08 IMAGING — MG DIGITAL SCREENING BILAT W/ TOMO W/ CAD
8 series · 8 of 24 positions shown · non-contrast
Comparison: Previous exam(s).

CLINICAL DATA: Screening.

EXAM:
DIGITAL SCREENING BILATERAL MAMMOGRAM WITH TOMO AND CAD

[R MLO synth-2D]
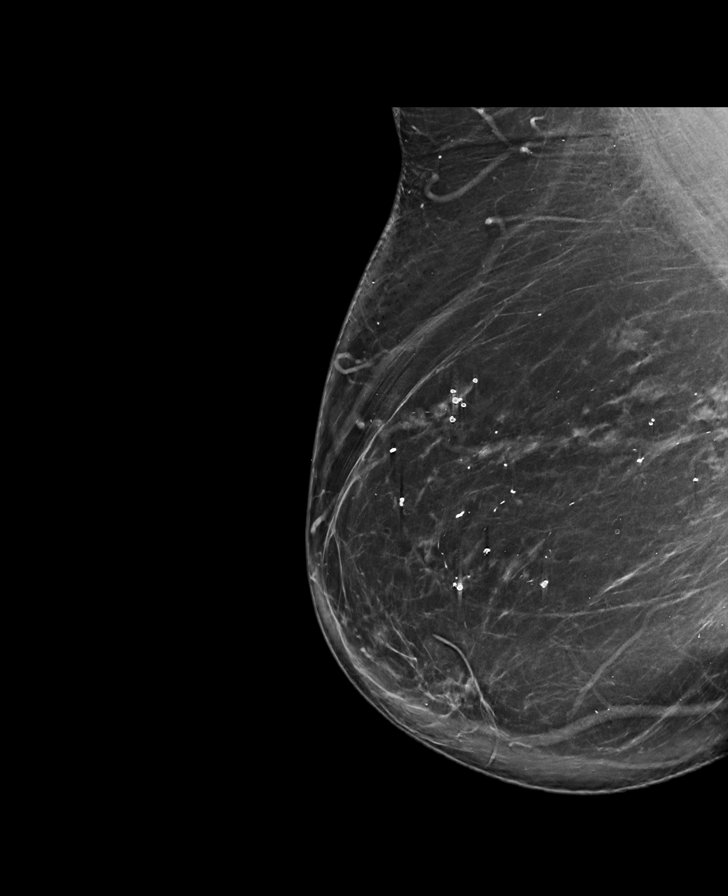

[R CC synth-2D]
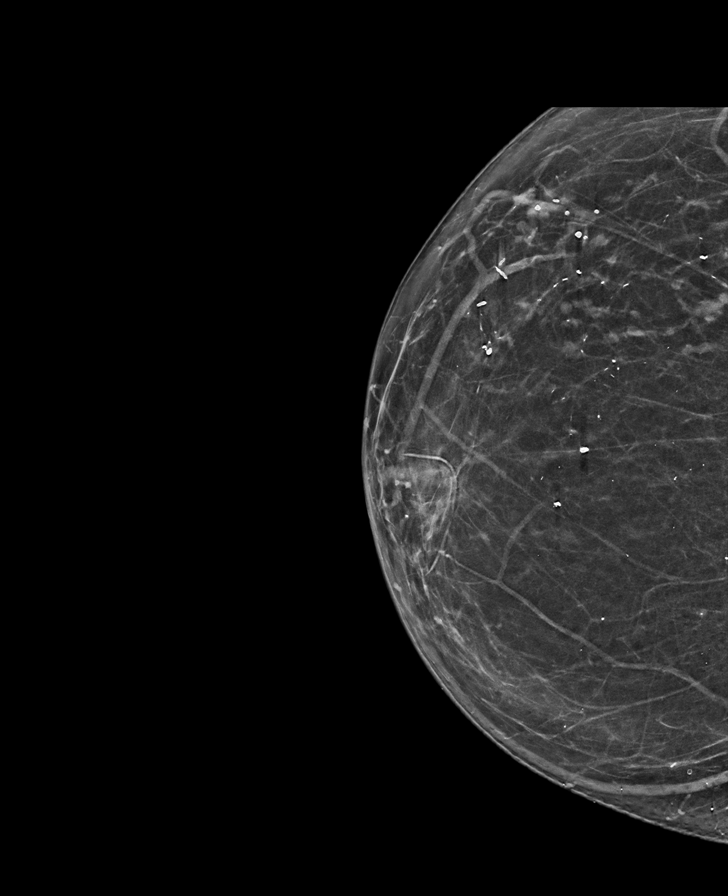

[L MLO synth-2D]
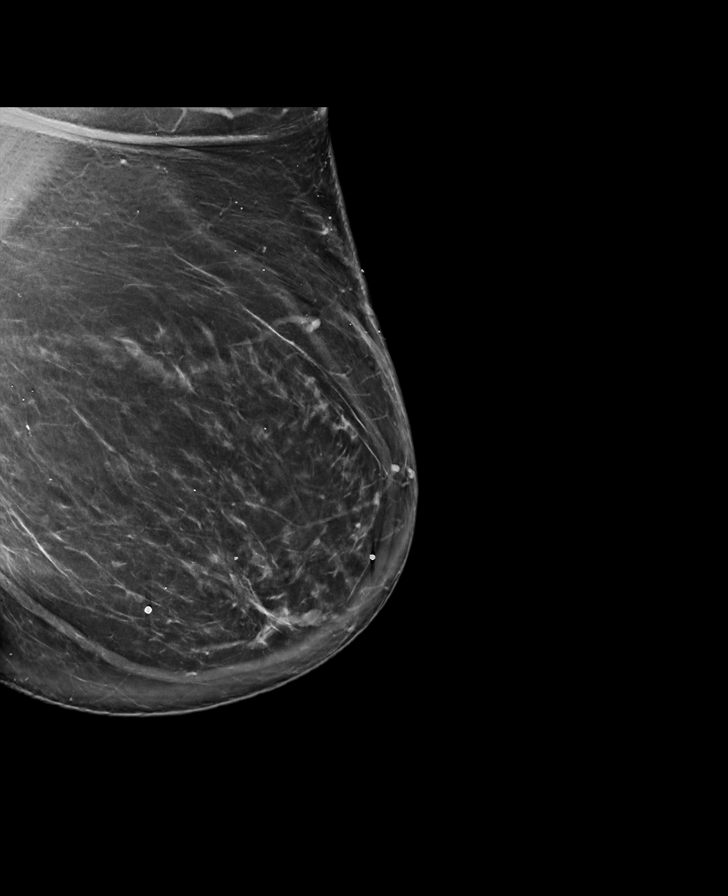

[L CC synth-2D]
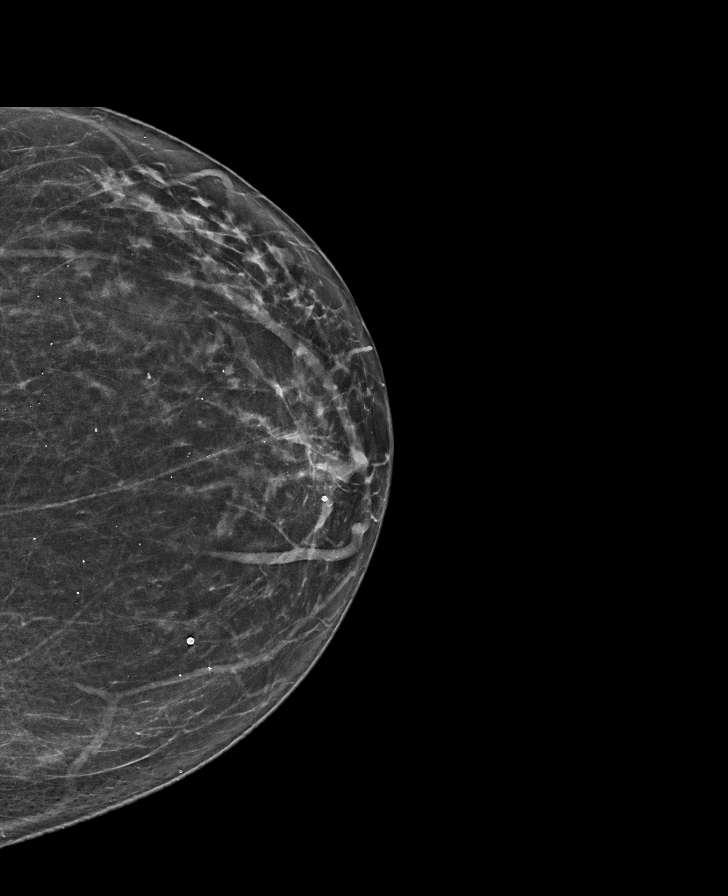

[R MLO tomo · tomo slice 49/97.0]
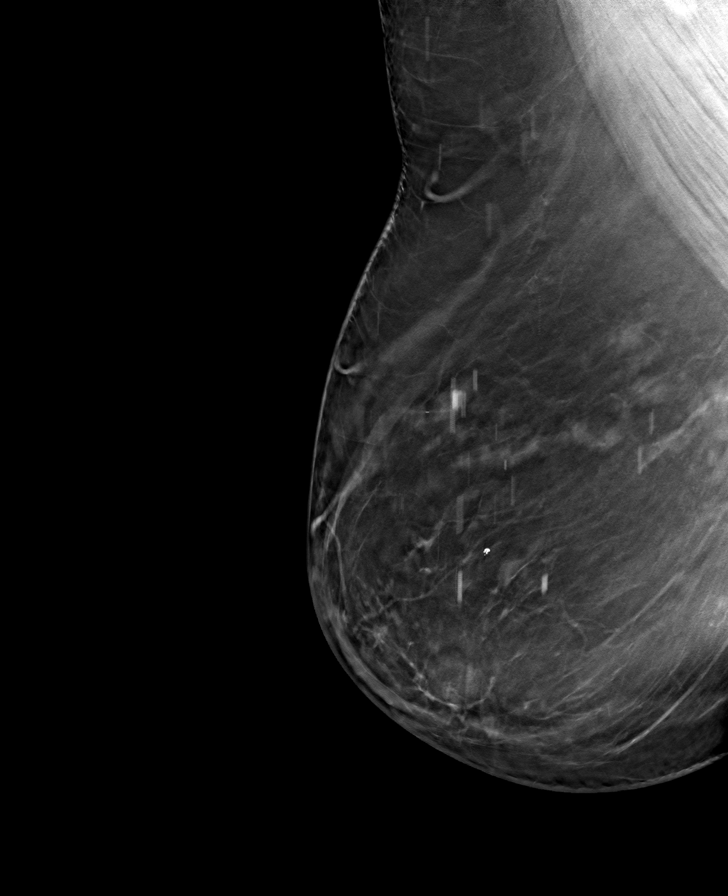

[R CC tomo · tomo slice 35/68.0]
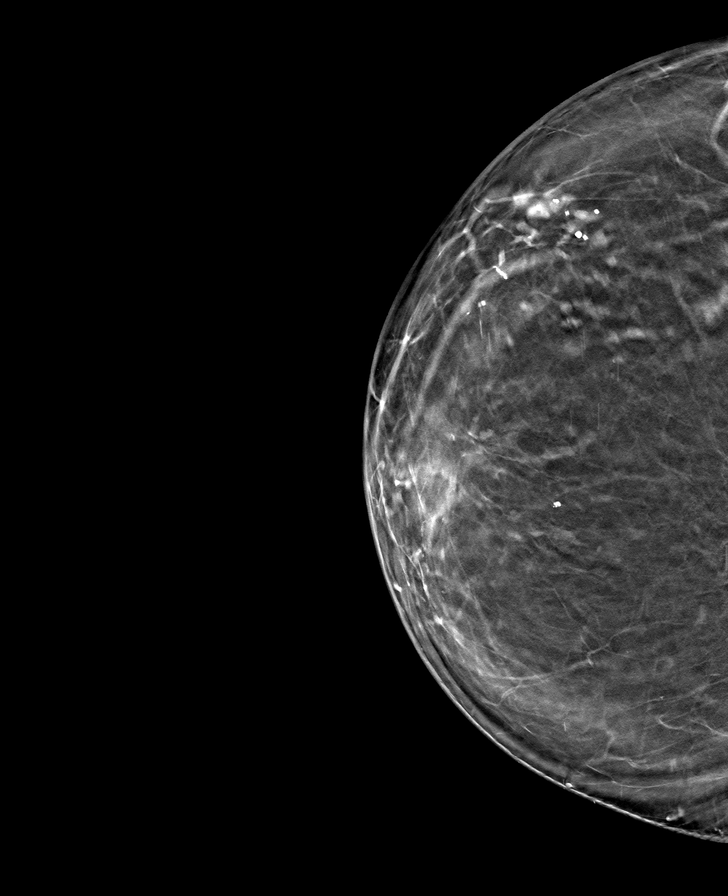

[L CC tomo · tomo slice 35/68.0]
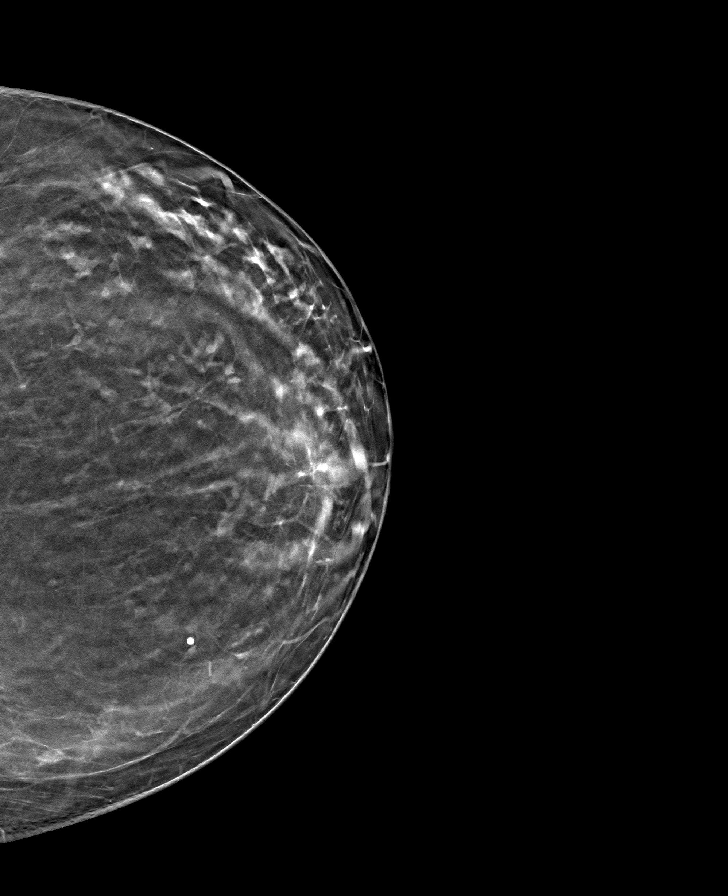

[L MLO tomo · tomo slice 48/95.0]
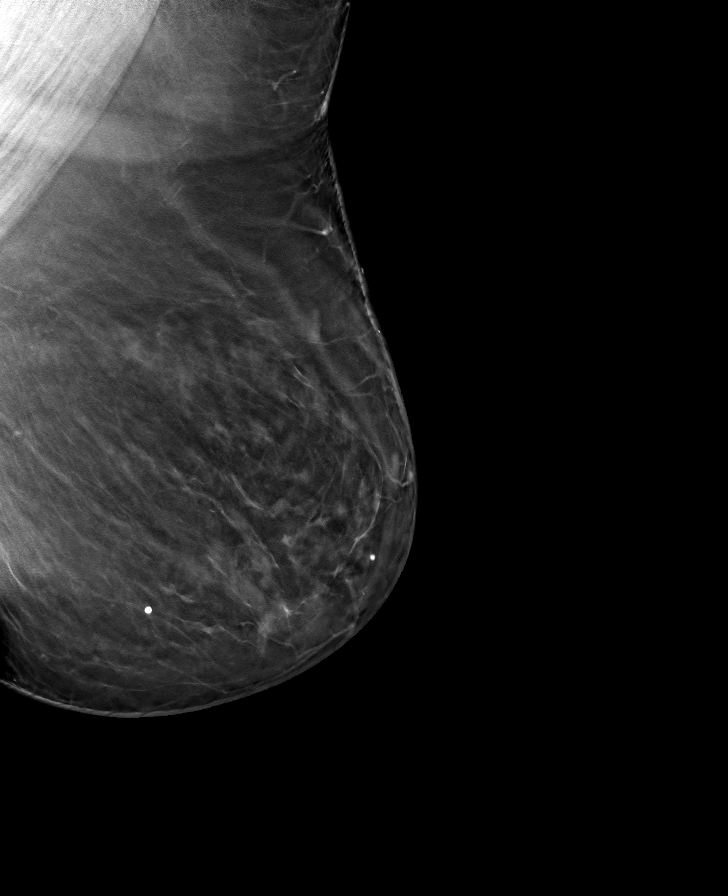

[8 of 24 positions shown; findings below may reference images not displayed]

ACR Breast Density Category b: There are scattered areas of
fibroglandular density.
FINDINGS: There are no findings suspicious for malignancy. Images were
processed with CAD.
IMPRESSION: No mammographic evidence of malignancy. A result letter of this
screening mammogram will be mailed directly to the patient.

RECOMMENDATION:
Screening mammogram in one year. (Code:[id number])

BI-RADS CATEGORY  1: Negative.

## 2021-04-05 ENCOUNTER — Other Ambulatory Visit: Payer: Self-pay

## 2021-04-05 ENCOUNTER — Ambulatory Visit (INDEPENDENT_AMBULATORY_CARE_PROVIDER_SITE_OTHER): Payer: Medicare Other | Admitting: Podiatry

## 2021-04-05 ENCOUNTER — Encounter: Payer: Self-pay | Admitting: Podiatry

## 2021-04-05 DIAGNOSIS — M79674 Pain in right toe(s): Secondary | ICD-10-CM

## 2021-04-05 DIAGNOSIS — M79675 Pain in left toe(s): Secondary | ICD-10-CM

## 2021-04-05 DIAGNOSIS — E1142 Type 2 diabetes mellitus with diabetic polyneuropathy: Secondary | ICD-10-CM

## 2021-04-05 DIAGNOSIS — B351 Tinea unguium: Secondary | ICD-10-CM

## 2021-04-05 DIAGNOSIS — L84 Corns and callosities: Secondary | ICD-10-CM

## 2021-04-11 NOTE — Progress Notes (Signed)
Subjective:  Patient ID: Connie Shelton, female    DOB: [birth year],  MRN: [id number]  70 y.o. female presents with at risk foot care with history of diabetic neuropathy and callus(es) b/l feet and painful thick toenails that are difficult to trim. Painful toenails interfere with ambulation. Aggravating factors include wearing enclosed shoe gear. Pain is relieved with periodic professional debridement. Painful calluses are aggravated when weightbearing with and without shoegear. Pain is relieved with periodic professional debridement.  She states she is enjoying her retirement.  Patient's blood sugar was 104 mg/dl today.  Patient states her last A1c was "9.something".  She notes no new pedal problems on today's visit.  PCP: Adrian Prince, MD and last visit was: 03/29/2021.  Review of Systems: Negative except as noted in the HPI.   Allergies  Allergen Reactions   Avelox [Moxifloxacin Hcl In Nacl]     Anaphylactic     Avelox [Moxifloxacin]     Other reaction(s): anaphalax   Bacitracin-Polymyxin B     Other reaction(s): Unknown   Bactrim [Sulfamethoxazole-Trimethoprim]     Hives    Empagliflozin     Other reaction(s): recurrent vaginal yeast Other reaction(s): recurrent vaginal yeast   Iodinated Diagnostic Agents     Hives  Other reaction(s): Unknown Other reaction(s): Unknown   Neosporin Plus Max St     Other reaction(s): Unknown   Quinolones     Other reaction(s): anaphalax   Sulfa Antibiotics     Other reaction(s): Unknown Other reaction(s): Unknown    Objective:  There were no vitals filed for this visit. Constitutional Patient is a pleasant 70 y.o. Caucasian female morbidly obese in NAD. AAO x 3.  Vascular Capillary fill time to digits immediate b/l.  DP/PT pulse(s) are palpable b/l lower extremities. Pedal hair sparse. Lower extremity skin temperature gradient within normal limits. No pain with calf compression b/l. No edema noted b/l lower extremities. No cyanosis  or clubbing noted.   Neurologic Protective sensation diminished with 10g monofilament b/l. Clonus negative b/l.  Dermatologic Skin warm and supple b/l lower extremities. No open wounds b/l lower extremities. No interdigital macerations b/l lower extremities. Toenails 1-5 b/l elongated, discolored, dystrophic, thickened, crumbly with subungual debris and tenderness to dorsal palpation. Hyperkeratotic lesion(s) L 4th toe, submet head 5 left foot, and submet head 5 right foot.  No erythema, no edema, no drainage, no fluctuance.  Orthopedic: Normal muscle strength 5/5 to all lower extremity muscle groups bilaterally. Patient ambulates independent of any assistive aids. No gross bony deformities b/l lower extremities.    Assessment:   1. Pain due to onychomycosis of toenails of both feet   2. Callus   3. Diabetic peripheral neuropathy associated with type 2 diabetes mellitus (HCC)    Plan:  Patient was evaluated and treated and all questions answered. Consent given for treatment as described below: -Examined patient. -Continue diabetic foot care principles: inspect feet daily, monitor glucose as recommended by PCP and/or Endocrinologist, and follow prescribed diet per PCP, Endocrinologist and/or dietician. -Patient to continue soft, supportive shoe gear daily. -Toenails 1-5 b/l were debrided in length and girth with sterile nail nippers and dremel without iatrogenic bleeding.  -Callus(es) L 4th toe, submet head 5 left foot, and submet head 5 right foot pared utilizing sterile scalpel blade without complication or incident. Total number debrided =3. -Patient to report any pedal injuries to medical professional immediately. -Patient/POA to call should there be question/concern in the interim.  Return in about 3 months (around 07/05/2021).  Marzetta Board, DPM

## 2021-07-05 ENCOUNTER — Other Ambulatory Visit: Payer: Self-pay

## 2021-07-05 ENCOUNTER — Ambulatory Visit: Payer: Medicare Other | Admitting: Podiatry

## 2021-07-05 DIAGNOSIS — E1142 Type 2 diabetes mellitus with diabetic polyneuropathy: Secondary | ICD-10-CM | POA: Diagnosis not present

## 2021-07-05 DIAGNOSIS — L84 Corns and callosities: Secondary | ICD-10-CM | POA: Diagnosis not present

## 2021-07-05 DIAGNOSIS — E119 Type 2 diabetes mellitus without complications: Secondary | ICD-10-CM

## 2021-07-05 DIAGNOSIS — M79674 Pain in right toe(s): Secondary | ICD-10-CM

## 2021-07-05 DIAGNOSIS — B351 Tinea unguium: Secondary | ICD-10-CM

## 2021-07-05 DIAGNOSIS — M79675 Pain in left toe(s): Secondary | ICD-10-CM

## 2021-07-05 NOTE — Progress Notes (Signed)
ANNUAL DIABETIC FOOT EXAM  Subjective: Kenney Houseman presents today for for annual diabetic foot examination and callus(es) bilaterally and painful thick toenails that are difficult to trim. Painful toenails interfere with ambulation. Aggravating factors include wearing enclosed shoe gear. Pain is relieved with periodic professional debridement. Painful calluses are aggravated when weightbearing with and without shoegear. Pain is relieved with periodic professional debridement..  Patient relates 70+ year h/o diabetes.  Patient denies any h/o foot wounds.  Patient relates symptoms of foot numbness of left LE.  Patient's blood sugar was 94 mg/dl this morning. Patient did not check blood glucose this morning.  Patient does not monitor blood glucose daily.  Reynold Bowen, MD is patient's PCP. Last visit was 70/06/2021.  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, associated with type 2 diabetes mellitus ([place]) 09/21/2020   Chronic kidney disease, stage 3a ([place]) 09/21/2020   Essential hypertension 01/03/2020   Anxiety disorder 06/14/2019   Chronic kidney disease due to hypertension 01/14/2019   Morbid obesity ([place]) 08/28/2018   Abnormal results of liver function studies 12/13/2017   Long term (current) use of insulin ([place]) 12/13/2017   Migraine 06/05/2015   Type 2 diabetes mellitus with proliferative retinopathy without macular edema ([place]) 07/04/2011   Gout 10/26/2010   Vitamin D deficiency 02/02/2010   Asthma without status asthmaticus 04/19/2009   Atherosclerotic heart disease of  native coronary artery without angina pectoris 04/19/2009   Hyperlipidemia 04/19/2009   Mild major depression, single episode ([place]) 04/19/2009   Non-toxic multinodular goiter 04/19/2009   Polyp of colon 04/19/2009   Psoriasis with arthropathy ([place]) 04/19/2009   Past Surgical History:  Procedure Laterality Date   ABDOMINAL HYSTERECTOMY     BREAST LUMPECTOMY     CARDIAC CATHETERIZATION     CHOLECYSTECTOMY     COLONOSCOPY     KIDNEY STONE SURGERY     stents   KNEE ARTHROSCOPY     bilateral   THYROGLOSSAL DUCT CYST     removed   TONSILLECTOMY     Current Outpatient Medications on File Prior to Visit  Medication Sig Dispense Refill   traMADol (ULTRAM) 50 MG tablet 1 tablet as needed     albuterol (PROAIR HFA) 108 (90 Base) MCG/ACT inhaler      ALPRAZolam (XANAX) 0.25 MG tablet      ammonium lactate (LAC-HYDRIN) 12 % lotion APPLY TO BOTH FEET TWICE DAILY 400 mL 3   B-D INSULIN SYRINGE 1CC/25G 25G X 5/8" 1 ML MISC Inject into the skin.     buPROPion (WELLBUTRIN) 75 MG tablet Take 75 mg by mouth at bedtime.     cephALEXin (KEFLEX) 500 MG capsule Take 500 mg by mouth 2 (two) times daily.     Cholecalciferol (VITAMIN D PO) Take 50,000 Units by mouth once a week.     Cholecalciferol (VITAMIN D3) 1.25 MG (50000 UT) CAPS Take by mouth.     colchicine (COLCRYS) 0.6 MG tablet      DULoxetine (CYMBALTA) 30 MG capsule  Take 30 mg by mouth daily.     DULoxetine (CYMBALTA) 60 MG capsule Take 60 mg by mouth daily.     DULoxetine (CYMBALTA) 60 MG capsule Take 1 tablet by mouth daily.     fluconazole (DIFLUCAN) 150 MG tablet Take 150 mg by mouth every 3 (three) days.     Fluticasone-Salmeterol (WIXELA INHUB IN) INHALE ONE PUFF TWICE A DAY     Fluticasone-Salmeterol (WIXELA INHUB) 100-50 MCG/DOSE AEPB      Glucagon (BAQSIMI ONE PACK) 3 MG/DOSE POWD      Glucagon, rDNA, (GLUCAGON EMERGENCY) 1 MG KIT      glucose blood (ONETOUCH ULTRA) test strip      glucose blood (ONETOUCH ULTRA) test strip       indomethacin (INDOCIN) 25 MG capsule TAKE ONE CAPSULE EVERY DAY AS NEEDED  5   insulin lispro (HUMALOG) 100 UNIT/ML injection INJECT UP TO 50 U A DAY UNITS IF BS IS ABOVE 250     Insulin Pen Needle (B-D ULTRAFINE III SHORT PEN) 31G X 8 MM MISC      insulin regular human CONCENTRATED (HUMULIN R) 500 UNIT/ML SOLN injection Inject into the skin 3 (three) times daily with meals.     JARDIANCE 25 MG TABS tablet Take 25 mg by mouth daily.  6   ketoconazole (NIZORAL) 2 % cream Apply 1 application topically daily. Apply to both feet and between toes once daily for 6 weeks 30 g 1   Lancets (ONETOUCH DELICA PLUS ATFTDD22G) MISC Apply topically.     metroNIDAZOLE (METROGEL) 1 % gel Apply 1 application topically daily.     ondansetron (ZOFRAN-ODT) 8 MG disintegrating tablet Take by mouth at bedtime.     OneTouch Delica Lancets 25K MISC      ONETOUCH ULTRA test strip SMARTSIG:1 Strip(s) Via Meter 3 Times a Week     predniSONE (DELTASONE) 20 MG tablet 67m 3 day then 130mx 3 days     ramipril (ALTACE) 10 MG capsule Take 1 capsule by mouth 2 (two) times daily.     Semaglutide (RYBELSUS) 7 MG TABS Take 1 tablet by mouth daily.     tobramycin (TOBREX) 0.3 % ophthalmic solution Place 1 drop into the right eye 4 (four) times daily.     torsemide (DEMADEX) 20 MG tablet TAKE TWO (2) TABLETS BY MOUTH TWICE DAILY     traMADol (ULTRAM) 50 MG tablet Take 50 mg by mouth 2 (two) times daily as needed.     ULTICARE TUBERCULIN SAFETY SYR 25G X 5/8" 1 ML MISC      valACYclovir (VALTREX) 500 MG tablet      Vitamin D, Ergocalciferol, (DRISDOL) 50000 units CAPS capsule TAKE ONE CAPSULE BY MOUTH ONE TIME PER WEEK  3   No current facility-administered medications on file prior to visit.    Allergies  Allergen Reactions   Avelox [Moxifloxacin Hcl In Nacl]     Anaphylactic     Bacitracin-Polymyxin B     Other reaction(s): Unknown   Bactrim [Sulfamethoxazole-Trimethoprim]     Hives    Empagliflozin     Other  reaction(s): recurrent vaginal yeast Other reaction(s): recurrent vaginal yeast   Iodinated Contrast Media     Hives  Other reaction(s): Unknown Other reaction(s): Unknown   Moxifloxacin     Other reaction(s): anaphalax Other reaction(s): anaphalax   Neomycin-Bacitracin Zn-Polymyx     Other reaction(s): Unknown   Neosporin Plus Max St     Other reaction(s): Unknown   Quinolones  Other reaction(s): anaphalax   Sulfa Antibiotics     Other reaction(s): Unknown Other reaction(s): Unknown Other reaction(s): Unknown   Social History   Occupational History   Not on file  Tobacco Use   Smoking status: Never   Smokeless tobacco: Never  Substance and Sexual Activity   Alcohol use: No   Drug use: No   Sexual activity: Not on file   Family History  Problem Relation Age of Onset   Gallstones Mother    Colon polyps Mother    Ulcers Mother    Cancer Father        liver and lung   Colon polyps Brother    Diabetes Brother    Cancer Paternal Uncle        colon all 3    There is no immunization history on file for this patient.   Review of Systems: Negative except as noted in the HPI.   Objective: There were no vitals filed for this visit.  ARISA CONGLETON is a pleasant 70 y.o. female in NAD. AAO X 3.  Vascular Examination: CFT immediate b/l LE. Palpable DP/PT pulses b/l LE. Digital hair sparse b/l. Skin temperature gradient WNL b/l. No pain with calf compression b/l. No edema noted b/l. No cyanosis or clubbing noted b/l LE.  Dermatological Examination: Pedal integument with normal turgor, texture and tone b/l LE. No open wounds b/l. No interdigital macerations b/l. Toenails 1-5 b/l elongated, thickened, discolored with subungual debris. +Tenderness with dorsal palpation of nailplates. Hyperkeratotic lesion(s) noted L 4th toe and submet head 5 b/l.  Musculoskeletal Examination: Normal muscle strength 5/5 to all lower extremity muscle groups bilaterally. No pain, crepitus  or joint limitation noted with ROM b/l LE. No gross bony pedal deformities b/l. Patient ambulates independently without assistive aids.  Footwear Assessment: Does the patient wear appropriate shoes? Yes. Does the patient need inserts/orthotics? No..  Neurological Examination: Protective sensation diminished with 10g monofilament b/l.  Assessment: 1. Pain due to onychomycosis of toenails of both feet   2. Callus   3. Diabetic peripheral neuropathy associated with type 2 diabetes mellitus ([place])   4. Encounter for diabetic foot exam ([place])     ADA Risk Categorization: Low Risk :  Patient has all of the following: Intact protective sensation No prior foot ulcer  No severe deformity Pedal pulses present  Plan: -Diabetic foot examination performed today. -Continue foot and shoe inspections daily. Monitor blood glucose per PCP/Endocrinologist's recommendations. -Mycotic toenails 1-5 bilaterally were debrided in length and girth with sterile nail nippers and dremel without incident. -Corn(s) L 4th toe and callus(es) submet head 5 b/l were pared utilizing sterile scalpel blade without incident. Total number debrided =3. -Patient/POA to call should there be question/concern in the interim.  Return in about 3 months (around 10/03/2021).  Marzetta Board, DPM

## 2021-07-13 ENCOUNTER — Encounter: Payer: Self-pay | Admitting: Podiatry

## 2021-10-05 ENCOUNTER — Ambulatory Visit: Payer: Medicare Other | Admitting: Podiatry

## 2022-02-25 ENCOUNTER — Ambulatory Visit: Payer: Medicare Other | Admitting: Podiatry

## 2022-03-03 ENCOUNTER — Ambulatory Visit: Payer: Medicare Other | Admitting: Podiatry

## 2022-11-24 ENCOUNTER — Other Ambulatory Visit (HOSPITAL_BASED_OUTPATIENT_CLINIC_OR_DEPARTMENT_OTHER): Payer: Self-pay

## 2022-11-24 MED ORDER — MOUNJARO 7.5 MG/0.5ML ~~LOC~~ SOAJ
SUBCUTANEOUS | 11 refills | Status: DC
  Filled 2022-11-24: qty 2, 28d supply, fill #0

## 2022-11-29 ENCOUNTER — Other Ambulatory Visit (HOSPITAL_BASED_OUTPATIENT_CLINIC_OR_DEPARTMENT_OTHER): Payer: Self-pay

## 2022-12-01 ENCOUNTER — Other Ambulatory Visit (HOSPITAL_COMMUNITY): Payer: Self-pay

## 2022-12-01 MED ORDER — MOUNJARO 5 MG/0.5ML ~~LOC~~ SOAJ
5.0000 mg | SUBCUTANEOUS | 11 refills | Status: DC
  Filled 2022-12-01: qty 2, 28d supply, fill #0

## 2022-12-02 ENCOUNTER — Other Ambulatory Visit (HOSPITAL_COMMUNITY): Payer: Self-pay

## 2022-12-05 ENCOUNTER — Ambulatory Visit: Payer: Medicare Other | Admitting: Podiatry

## 2022-12-05 ENCOUNTER — Encounter: Payer: Self-pay | Admitting: Podiatry

## 2022-12-05 DIAGNOSIS — E119 Type 2 diabetes mellitus without complications: Secondary | ICD-10-CM | POA: Diagnosis not present

## 2022-12-05 DIAGNOSIS — E1142 Type 2 diabetes mellitus with diabetic polyneuropathy: Secondary | ICD-10-CM | POA: Diagnosis not present

## 2022-12-05 DIAGNOSIS — B351 Tinea unguium: Secondary | ICD-10-CM

## 2022-12-05 DIAGNOSIS — M79675 Pain in left toe(s): Secondary | ICD-10-CM

## 2022-12-05 DIAGNOSIS — M79674 Pain in right toe(s): Secondary | ICD-10-CM | POA: Diagnosis not present

## 2022-12-05 DIAGNOSIS — L84 Corns and callosities: Secondary | ICD-10-CM | POA: Diagnosis not present

## 2022-12-05 NOTE — Progress Notes (Signed)
  Subjective:  Patient ID: Connie Shelton, female    DOB: Dec 01, 1950,   MRN: [id number]  Chief Complaint  Patient presents with   Diabetes    Diabetic foot care   Nail Problem     Left great toe injury - patient wants to make sure toenail looks fine because she is a diabetic     72 y.o. female presents for concern of thickened elongated and painful nails that are difficult to trim. Requesting to have them trimmed today. Relates burning and tingling in their feet. Patient is diabetic and last A1c was 6.5.  Relates concern for left great toe injury that happened a couple days ago and nail cracked off after hitting it. Wants to make sure it is ok.   PCP:  Adrian Prince, MD    . Denies any other pedal complaints. Denies n/v/f/c.   Past Medical History:  Diagnosis Date   Asthma    Cellulitis    Cholelithiasis    Diabetes mellitus (HCC)    Esophageal reflux    History of liver biopsy    HTN (hypertension)    Hx of colonic polyps    Kidney stone    Obesity    Psoriatic arthritis (HCC)    Steatohepatitis     Objective:  Physical Exam: Vascular: DP/PT pulses 2/4 bilateral. CFT <3 seconds. Absent hair growth on digits. Edema noted to bilateral lower extremities. Xerosis noted bilaterally.  Skin. No lacerations or abrasions bilateral feet. Nails 1-5 bilateral  are thickened discolored and elongated with subungual debris. Hyperkeratotic lesion noted to plantar fourth digit.  Musculoskeletal: MMT 5/5 bilateral lower extremities in DF, PF, Inversion and Eversion. Deceased ROM in DF of ankle joint.  Neurological: Sensation intact to light touch. Protective sensation diminished bilateral.    Assessment:   1. Pain due to onychomycosis of toenails of both feet   2. Callus   3. Diabetic peripheral neuropathy associated with type 2 diabetes mellitus (HCC)   4. Encounter for diabetic foot exam (HCC)      Plan:  Patient was evaluated and treated and all questions answered. -Discussed  and educated patient on diabetic foot care, especially with  regards to the vascular, neurological and musculoskeletal systems.  -Stressed the importance of good glycemic control and the detriment of not  controlling glucose levels in relation to the foot. -Discussed supportive shoes at all times and checking feet regularly.  -Mechanically debrided all nails 1-5 bilateral using sterile nail nipper and filed with dremel without incident  -Hyperkeratotic tissue debrided without incident.  -Answered all patient questions -Patient to return  in 3 months for at risk foot care -Patient advised to call the office if any problems or questions arise in the meantime.   Louann Sjogren, DPM

## 2022-12-06 ENCOUNTER — Other Ambulatory Visit (HOSPITAL_COMMUNITY): Payer: Self-pay

## 2022-12-09 ENCOUNTER — Other Ambulatory Visit (HOSPITAL_COMMUNITY): Payer: Self-pay

## 2023-03-09 ENCOUNTER — Ambulatory Visit: Payer: Medicare Other | Admitting: Podiatry

## 2023-03-09 ENCOUNTER — Encounter: Payer: Self-pay | Admitting: Podiatry

## 2023-03-09 DIAGNOSIS — M79674 Pain in right toe(s): Secondary | ICD-10-CM

## 2023-03-09 DIAGNOSIS — E1142 Type 2 diabetes mellitus with diabetic polyneuropathy: Secondary | ICD-10-CM

## 2023-03-09 DIAGNOSIS — L84 Corns and callosities: Secondary | ICD-10-CM

## 2023-03-09 DIAGNOSIS — M79675 Pain in left toe(s): Secondary | ICD-10-CM

## 2023-03-09 DIAGNOSIS — B351 Tinea unguium: Secondary | ICD-10-CM

## 2023-03-09 NOTE — Progress Notes (Signed)
  Subjective:  Patient ID: Connie Shelton, female    DOB: 1951/04/26,   MRN: [id number]  Chief Complaint  Patient presents with   Nail Problem    Pt present today for a diabetic foot care routine. Pt denies pain.    72 y.o. female presents for concern of thickened elongated and painful nails that are difficult to trim. Requesting to have them trimmed today. Relates burning and tingling in their feet. Patient is diabetic and last A1c was 6.5.  Relates concern for left great toe injury that happened a couple days ago and nail cracked off after hitting it. Wants to make sure it is ok.   PCP:  Adrian Prince, MD    . Denies any other pedal complaints. Denies n/v/f/c.   Past Medical History:  Diagnosis Date   Asthma    Cellulitis    Cholelithiasis    Diabetes mellitus (HCC)    Esophageal reflux    History of liver biopsy    HTN (hypertension)    Hx of colonic polyps    Kidney stone    Obesity    Psoriatic arthritis (HCC)    Steatohepatitis     Objective:  Physical Exam: Vascular: DP/PT pulses 2/4 bilateral. CFT <3 seconds. Absent hair growth on digits. Edema noted to bilateral lower extremities. Xerosis noted bilaterally.  Skin. No lacerations or abrasions bilateral feet. Nails 1-5 bilateral  are thickened discolored and elongated with subungual debris. Hyperkeratotic lesion noted to plantar fourth digit.  Musculoskeletal: MMT 5/5 bilateral lower extremities in DF, PF, Inversion and Eversion. Deceased ROM in DF of ankle joint.  Neurological: Sensation intact to light touch. Protective sensation diminished bilateral.    Assessment:   1. Pain due to onychomycosis of toenails of both feet   2. Callus   3. Diabetic peripheral neuropathy associated with type 2 diabetes mellitus (HCC)       Plan:  Patient was evaluated and treated and all questions answered. -Discussed and educated patient on diabetic foot care, especially with  regards to the vascular, neurological and  musculoskeletal systems.  -Stressed the importance of good glycemic control and the detriment of not  controlling glucose levels in relation to the foot. -Discussed supportive shoes at all times and checking feet regularly.  -Mechanically debrided all nails 1-5 bilateral using sterile nail nipper and filed with dremel without incident  -Hyperkeratotic tissue debrided without incident.  -Answered all patient questions -Patient to return  in 3 months for at risk foot care -Patient advised to call the office if any problems or questions arise in the meantime.   Louann Sjogren, DPM

## 2023-06-05 ENCOUNTER — Other Ambulatory Visit (HOSPITAL_BASED_OUTPATIENT_CLINIC_OR_DEPARTMENT_OTHER): Payer: Self-pay

## 2023-06-05 MED ORDER — FLUTICASONE PROPIONATE 50 MCG/ACT NA SUSP
1.0000 | Freq: Two times a day (BID) | NASAL | 3 refills | Status: DC
  Filled 2023-06-05: qty 16, 30d supply, fill #0

## 2023-06-05 MED ORDER — AMOXICILLIN 500 MG PO CAPS
500.0000 mg | ORAL_CAPSULE | Freq: Three times a day (TID) | ORAL | 0 refills | Status: DC
  Filled 2023-06-05: qty 15, 5d supply, fill #0

## 2023-06-08 ENCOUNTER — Ambulatory Visit: Payer: Medicare Other | Admitting: Podiatry

## 2023-10-04 ENCOUNTER — Encounter: Payer: Self-pay | Admitting: Neurology

## 2023-10-05 ENCOUNTER — Ambulatory Visit: Payer: Medicare Other | Admitting: Neurology

## 2023-10-05 ENCOUNTER — Encounter: Payer: Self-pay | Admitting: Neurology

## 2023-10-05 VITALS — BP 146/88 | Ht 68.0 in | Wt 279.0 lb

## 2023-10-05 DIAGNOSIS — R296 Repeated falls: Secondary | ICD-10-CM

## 2023-10-05 DIAGNOSIS — R251 Tremor, unspecified: Secondary | ICD-10-CM | POA: Diagnosis not present

## 2023-10-05 NOTE — Patient Instructions (Signed)
 Routine EEG, I will contact you to go over the results  Continue your other medications  Please call us if worse or any other concerns or questions Continue to follow up with PCP and return if worse

## 2023-10-05 NOTE — Progress Notes (Signed)
 [facility]  PATIENT: Connie Shelton DOB: [birth year]  REQUESTING CLINICIAN: Adrian Prince, MD HISTORY FROM: Patient  REASON FOR VISIT: Fall    HISTORICAL  CHIEF COMPLAINT:  Chief Complaint  Patient presents with   New Patient (Initial Visit)    Pt in 12, here alone Pt is referred for drop attacks/syncope. Pt states when she has this episodes she does not lose consciousness. States had 3 episodes last year. Pt also states she has tremors on both hands.     HISTORY OF PRESENT ILLNESS:  This is a 73 year old woman past medical history of anxiety, hypertension, hyperlipidemia, diabetes, obesity, anxiety, asthma, neuropathy who is presenting with complaint of multiple falls.  Patient report, having a fall back in October 2022 when she fractured her left knee.  It healed without complication.   In May 2024, she fell walking in her backyard, tells me the grass was wet but does not recall slipping or tripping and falling.  She did not lose consciousness, no dizziness, no warning signs before the fall.  She told me a few months later she did have a fall at home, she bent down to pick up her dog and next thing she fall backward, while holding the dog, again no warning signs, no trip and fall.  Her last fall was in June 30, 2023 when she was at her cousin house, She Was Walking with a Few Blocks and Again [employer], Her Face Hitting the Ground She Did Not Try to Brace Herself.  Again No Warning Sign, No Trip and Fall, denies sleeping.  With all these falls she denies any loss of consciousness.   She is also complaining of a new tremors, diagnosed with intentional tremors. Today she is not symptomatic but state sometimes,she has difficulty writing, difficulty with fine movement due to the tremors. Reports that her father suffered from Parkinson Disease.     OTHER MEDICAL CONDITIONS: Hypertension, Hyperlipidemia, diabetes, Obesity, Neuropathy, Anxiety, Asthma    REVIEW  OF SYSTEMS: Full 14 system review of systems performed and negative with exception of: As noted in the HPI  ALLERGIES: Allergies  Allergen Reactions   Avelox [Moxifloxacin Hcl In Nacl]     Anaphylactic     Bacitracin-Polymyxin B     Other reaction(s): Unknown   Bactrim [Sulfamethoxazole-Trimethoprim]     Hives    Empagliflozin     Other reaction(s): recurrent vaginal yeast Other reaction(s): recurrent vaginal yeast   Iodinated Contrast Media     Hives  Other reaction(s): Unknown Other reaction(s): Unknown   Moxifloxacin     Other reaction(s): anaphalax Other reaction(s): anaphalax   Neo-Bacit-Poly-Lidocaine     Other reaction(s): Unknown   Neomycin-Bacitracin Zn-Polymyx     Other reaction(s): Unknown   Quinolones     Other reaction(s): anaphalax   Sulfa Antibiotics     Other reaction(s): Unknown Other reaction(s): Unknown Other reaction(s): Unknown    HOME MEDICATIONS: Outpatient Medications Prior to Visit  Medication Sig Dispense Refill   albuterol (PROAIR HFA) 108 (90 Base) MCG/ACT inhaler      ammonium lactate (LAC-HYDRIN) 12 % lotion APPLY TO BOTH FEET TWICE DAILY 400 mL 3   B-D INSULIN SYRINGE 1CC/25G 25G X 5/8" 1 ML MISC Inject into the skin.     buPROPion (WELLBUTRIN) 75 MG tablet Take 75 mg by mouth at bedtime.     cephALEXin (KEFLEX) 500 MG capsule Take 500 mg by mouth 2 (two) times daily.     Cholecalciferol (VITAMIN D  PO) Take 50,000 Units by mouth once a week.     Cholecalciferol (VITAMIN D3) 1.25 MG (50000 UT) CAPS Take by mouth.     DULoxetine (CYMBALTA) 30 MG capsule Take 30 mg by mouth daily.     DULoxetine (CYMBALTA) 60 MG capsule Take 60 mg by mouth daily.     fluconazole (DIFLUCAN) 150 MG tablet Take 150 mg by mouth every 3 (three) days.     Fluticasone-Salmeterol (WIXELA INHUB IN) INHALE ONE PUFF TWICE A DAY     Fluticasone-Salmeterol (WIXELA INHUB) 100-50 MCG/DOSE AEPB      Glucagon (BAQSIMI ONE PACK) 3 MG/DOSE POWD      Glucagon (BAQSIMI TWO  PACK) 3 MG/DOSE POWD Place into the nose.     Glucagon, rDNA, (GLUCAGON EMERGENCY) 1 MG KIT      glucose blood (ONETOUCH ULTRA) test strip      glucose blood (ONETOUCH ULTRA) test strip      indomethacin (INDOCIN) 25 MG capsule TAKE ONE CAPSULE EVERY DAY AS NEEDED  5   insulin lispro (HUMALOG) 100 UNIT/ML injection INJECT UP TO 50 U A DAY UNITS IF BS IS ABOVE 250     Insulin Pen Needle (B-D ULTRAFINE III SHORT PEN) 31G X 8 MM MISC      insulin regular human CONCENTRATED (HUMULIN R) 500 UNIT/ML SOLN injection Inject into the skin 3 (three) times daily with meals.     JARDIANCE 25 MG TABS tablet Take 25 mg by mouth daily.  6   ketoconazole (NIZORAL) 2 % cream Apply 1 application topically daily. Apply to both feet and between toes once daily for 6 weeks 30 g 1   Lancets (ONETOUCH DELICA PLUS LANCET30G) MISC Apply topically.     metroNIDAZOLE (METROGEL) 1 % gel Apply 1 application topically daily.     ondansetron (ZOFRAN-ODT) 8 MG disintegrating tablet Take by mouth at bedtime.     OneTouch Delica Lancets 33G MISC      ONETOUCH ULTRA test strip SMARTSIG:1 Strip(s) Via Meter 3 Times a Week     ramipril (ALTACE) 10 MG capsule Take 1 capsule by mouth 2 (two) times daily.     tobramycin (TOBREX) 0.3 % ophthalmic solution Place 1 drop into the right eye 4 (four) times daily.     torsemide (DEMADEX) 20 MG tablet TAKE TWO (2) TABLETS BY MOUTH TWICE DAILY     traMADol (ULTRAM) 50 MG tablet Take 50 mg by mouth 2 (two) times daily as needed.     traMADol (ULTRAM) 50 MG tablet 1 tablet as needed     ULTICARE TUBERCULIN SAFETY SYR 25G X 5/8" 1 ML MISC      valACYclovir (VALTREX) 500 MG tablet      Vitamin D, Ergocalciferol, (DRISDOL) 50000 units CAPS capsule TAKE ONE CAPSULE BY MOUTH ONE TIME PER WEEK  3   insulin degludec (TRESIBA) 200 UNIT/ML FlexTouch Pen Inject into the skin.     ALPRAZolam (XANAX) 0.25 MG tablet      colchicine (COLCRYS) 0.6 MG tablet      DULoxetine (CYMBALTA) 60 MG capsule Take 1  tablet by mouth daily.     predniSONE (DELTASONE) 20 MG tablet 20mg  3 day then 10mg  x 3 days     Semaglutide (RYBELSUS) 7 MG TABS Take 1 tablet by mouth daily.     tirzepatide [facility]) 5 MG/0.5ML Pen Inject 5 mg (0.5 ml) into the skin once a week. 2 mL 11   tirzepatide (MOUNJARO) 7.5 MG/0.5ML Pen inject 7.5mg  under  the skin weekly 30 days. 2 mL 11   No facility-administered medications prior to visit.    PAST MEDICAL HISTORY: Past Medical History:  Diagnosis Date   Asthma    Cellulitis    Cholelithiasis    Diabetes mellitus (HCC)    Esophageal reflux    GI bleed    2003   History of liver biopsy    HTN (hypertension)    Hx of colonic polyps    2005/2008   Kidney stone    Obesity    Psoriatic arthritis (HCC)    Steatohepatitis     PAST SURGICAL HISTORY: Past Surgical History:  Procedure Laterality Date   ABDOMINAL HYSTERECTOMY     BREAST LUMPECTOMY     CARDIAC CATHETERIZATION     CHOLECYSTECTOMY     COLONOSCOPY     KIDNEY STONE SURGERY     stents   KNEE ARTHROSCOPY     bilateral   THYROGLOSSAL DUCT CYST     removed   TONSILLECTOMY      FAMILY HISTORY: Family History  Problem Relation Age of Onset   Gallstones Mother    Colon polyps Mother    Ulcers Mother    Cancer Father        liver and lung   Colon polyps Brother    Diabetes Brother    Cancer Paternal Uncle        colon all 3    SOCIAL HISTORY: Social History   Socioeconomic History   Marital status: Divorced    Spouse name: Not on file   Number of children: Not on file   Years of education: Not on file   Highest education level: Not on file  Occupational History   Not on file  Tobacco Use   Smoking status: Never   Smokeless tobacco: Never  Vaping Use   Vaping status: Never Used  Substance and Sexual Activity   Alcohol use: No   Drug use: No   Sexual activity: Not on file  Other Topics Concern   Not on file  Social History Narrative   Not on file   Social Drivers of Health    Financial Resource Strain: Not on file  Food Insecurity: Not on file  Transportation Needs: Not on file  Physical Activity: Not on file  Stress: Not on file  Social Connections: Not on file  Intimate Partner Violence: Not on file    PHYSICAL EXAM  GENERAL EXAM/CONSTITUTIONAL: Vitals:  Vitals:   10/05/23 1515 10/05/23 1546  BP: 138/86 (!) 146/88  Weight: 279 lb (126.6 kg)   Height: 5\' 8"  (1.727 m)    Body mass index is 42.42 kg/m. Wt Readings from Last 3 Encounters:  10/05/23 279 lb (126.6 kg)  01/16/16 299 lb 8 oz (135.9 kg)   Patient is in no distress; well developed, nourished and groomed; neck is supple  MUSCULOSKELETAL: Gait, strength, tone, movements noted in Neurologic exam below  NEUROLOGIC: MENTAL STATUS:      No data to display         awake, alert, oriented to person, place and time recent and remote memory intact normal attention and concentration language fluent, comprehension intact, naming intact fund of knowledge appropriate  CRANIAL NERVE:  2nd, 3rd, 4th, 6th - Visual fields full to confrontation, extraocular muscles intact, no nystagmus 5th - facial sensation symmetric 7th - facial strength symmetric 8th - hearing intact 9th - palate elevates symmetrically, uvula midline 11th - shoulder shrug symmetric 12th - tongue protrusion  midline  MOTOR:  normal bulk and tone, full strength in the BUE, BLE  SENSORY:  normal and symmetric to light touch  COORDINATION:  finger-nose-finger, fine finger movements normal, mild action tremors with the left hand   GAIT/STATION:  normal   DIAGNOSTIC DATA (LABS, IMAGING, TESTING) - I reviewed patient records, labs, notes, testing and imaging myself where available.  Lab Results  Component Value Date   WBC 10.5 01/13/2008   HGB 14.3 12/17/2008   HCT 42.0 12/17/2008   MCV 91.2 01/13/2008   PLT 316 01/13/2008      Component Value Date/Time   NA 141 12/17/2008 1239   K 3.8 12/17/2008 1239    CL 103 01/13/2008 2118   CO2 26 03/30/2007 0940   GLUCOSE 137 (H) 12/17/2008 1239   BUN 8 01/13/2008 2118   CREATININE 0.7 01/13/2008 2118   CALCIUM 9.1 03/30/2007 0940   GFRNONAA >60 03/30/2007 0940   GFRAA  03/30/2007 0940    >60        The eGFR has been calculated using the MDRD equation. This calculation has not been validated in all clinical   No results found for: "CHOL", "HDL", "LDLCALC", "LDLDIRECT", "TRIG", "CHOLHDL" No results found for: "HGBA1C" No results found for: "VITAMINB12" No results found for: "TSH"     ASSESSMENT AND PLAN  73 y.o. year old female with anxiety, hypertension, hyperlipidemia, diabetes, obesity, asthma, neuropathy who is presenting with complaint of multiple falls.  Denies any slip and fall, denies any dizziness or warning signs prior to the fall.  My suspicion for drop attacks/seizures are low, but we will obtain a routine EEG.  Patient does have a history of diabetes and neuropathy, I do wonder if she is actually having mechanical falls (slipping or tripping) due to her neuropathy, she is not able to tell a difference.  I will contact her to go over the results of the EEG but if negative she should continue to follow with PCP and contact us for any other questions or concerns. In terms of the tremors, today she was not symptomatic but there was a mild action tremor with her left hand.  Advised patient at this time, she does not need any medications, her tremor are not consistent with parkinsonism and she should contact us if her symptoms do get worse.  She voiced understanding.   1. Falls   2. Tremor      Patient Instructions  Routine EEG, I will contact you to go over the results  Continue your other medications  Please call us if worse or any other concerns or questions Continue to follow up with PCP and return if worse   Orders Placed This Encounter  Procedures   EEG adult    No orders of the defined types were placed in this  encounter.   No follow-ups on file.    Windell Norfolk, MD 10/05/2023, 5:18 PM  [facility] [address], [address] [place], [place] [address] [phone]

## 2023-10-12 ENCOUNTER — Ambulatory Visit: Admitting: Neurology

## 2023-10-12 DIAGNOSIS — R55 Syncope and collapse: Secondary | ICD-10-CM

## 2023-10-12 DIAGNOSIS — R296 Repeated falls: Secondary | ICD-10-CM

## 2023-10-12 NOTE — Procedures (Signed)
   History:  73 year old woman with unexplained falls.   EEG classification:  Awake and asleep  Duration: minutes   Technical aspects: This EEG study was done with scalp electrodes positioned according to the 10-20 International system of electrode placement. Electrical activity was reviewed with band pass filter of 1-70Hz , sensitivity of 7 uV/mm, display speed of 85mm/sec with a 60Hz  notched filter applied as appropriate. EEG data were recorded continuously and digitally stored.   Description of the recording: The background rhythms of this recording consists of a fairly well modulated medium amplitude background activity of 11 Hz. As the record progresses, the patient initially is in the waking state, but appears to enter the early stage II sleep during the recording, with rudimentary sleep spindles and vertex sharp wave activity seen. During the wakeful state, photic stimulation was performed, and no abnormal responses were seen. Hyperventilation was also performed, no abnormal response seen. No epileptiform discharges seen during this recording. There was no focal slowing.   Abnormality: None   Impression: This is a normal awake and sleep EEG. No evidence of interictal epileptiform discharges. Normal EEGs, however, do not rule out epilepsy.    Windell Norfolk, MD [facility]

## 2023-10-13 ENCOUNTER — Encounter: Payer: Self-pay | Admitting: Neurology

## 2023-10-17 ENCOUNTER — Telehealth: Payer: Self-pay | Admitting: Neurology

## 2023-10-17 NOTE — Telephone Encounter (Signed)
 Patient said was having tremors while in  a restaurant, not hold the menu in my hand. I know the EEG was negative, but want to find out the cause. Would like a call back to discuss what to do.  Pt has scheduled appt on 05/07/24 at 11:45 am

## 2023-10-17 NOTE — Telephone Encounter (Signed)
 Call to patient who reports increased tremors Sunday and not being able to hold a menu and family member pointing tremors out. She has not fallen but had near falls. She is happy EEG was normal but is concern why tremors are happening and if anything can be done to help or explanation of why they are happening. I advised I would send to Dr. Teresa Coombs for review. She was very nice and appreciative of call.

## 2023-10-19 ENCOUNTER — Other Ambulatory Visit: Payer: Self-pay | Admitting: Endocrinology

## 2023-10-19 DIAGNOSIS — Z1231 Encounter for screening mammogram for malignant neoplasm of breast: Secondary | ICD-10-CM

## 2023-10-23 ENCOUNTER — Ambulatory Visit
Admission: RE | Admit: 2023-10-23 | Discharge: 2023-10-23 | Disposition: A | Discharge status: Home / Self Care | Payer: Self-pay | Source: Ambulatory Visit | Attending: Endocrinology | Admitting: Endocrinology

## 2023-10-23 DIAGNOSIS — Z1231 Encounter for screening mammogram for malignant neoplasm of breast: Secondary | ICD-10-CM

## 2023-10-26 ENCOUNTER — Other Ambulatory Visit: Payer: Self-pay | Admitting: Endocrinology

## 2023-10-26 DIAGNOSIS — R928 Other abnormal and inconclusive findings on diagnostic imaging of breast: Secondary | ICD-10-CM

## 2023-10-27 ENCOUNTER — Encounter: Payer: Self-pay | Admitting: Podiatry

## 2023-10-27 ENCOUNTER — Ambulatory Visit: Admitting: Podiatry

## 2023-10-27 DIAGNOSIS — B351 Tinea unguium: Secondary | ICD-10-CM | POA: Diagnosis not present

## 2023-10-27 DIAGNOSIS — M79674 Pain in right toe(s): Secondary | ICD-10-CM | POA: Diagnosis not present

## 2023-10-27 DIAGNOSIS — M79675 Pain in left toe(s): Secondary | ICD-10-CM

## 2023-10-27 DIAGNOSIS — E1142 Type 2 diabetes mellitus with diabetic polyneuropathy: Secondary | ICD-10-CM | POA: Diagnosis not present

## 2023-10-27 DIAGNOSIS — L84 Corns and callosities: Secondary | ICD-10-CM

## 2023-10-27 NOTE — Progress Notes (Signed)
  Subjective:  Patient ID: Connie Shelton, female    DOB: [birth year],   MRN: [id number]  No chief complaint on file.   73 y.o. female presents for concern of thickened elongated and painful nails that are difficult to trim. Requesting to have them trimmed today. Relates burning and tingling in their feet. Patient is diabetic and last A1c was 6.5.   PCP:  Adrian Prince, MD    . Denies any other pedal complaints. Denies n/v/f/c.   Past Medical History:  Diagnosis Date   Asthma    Cellulitis    Cholelithiasis    Diabetes mellitus (HCC)    Esophageal reflux    GI bleed    2003   History of liver biopsy    HTN (hypertension)    Hx of colonic polyps    2005/2008   Kidney stone    Obesity    Psoriatic arthritis (HCC)    Steatohepatitis     Objective:  Physical Exam: Vascular: DP/PT pulses 2/4 bilateral. CFT <3 seconds. Absent hair growth on digits. Edema noted to bilateral lower extremities. Xerosis noted bilaterally.  Skin. No lacerations or abrasions bilateral feet. Nails 1-5 bilateral  are thickened discolored and elongated with subungual debris. Hyperkeratotic lesion noted to plantar fourth digit.  Musculoskeletal: MMT 5/5 bilateral lower extremities in DF, PF, Inversion and Eversion. Deceased ROM in DF of ankle joint.  Neurological: Sensation intact to light touch. Protective sensation diminished bilateral.    Assessment:   1. Pain due to onychomycosis of toenails of both feet   2. Callus   3. Diabetic peripheral neuropathy associated with type 2 diabetes mellitus (HCC)       Plan:  Patient was evaluated and treated and all questions answered. -Discussed and educated patient on diabetic foot care, especially with  regards to the vascular, neurological and musculoskeletal systems.  -Stressed the importance of good glycemic control and the detriment of not  controlling glucose levels in relation to the foot. -Discussed supportive shoes at all times and checking feet  regularly.  -Mechanically debrided all nails 1-5 bilateral using sterile nail nipper and filed with dremel without incident  -Hyperkeratotic tissue debrided without incident.  -Answered all patient questions -Patient to return  in 3 months for at risk foot care -Patient advised to call the office if any problems or questions arise in the meantime.   Louann Sjogren, DPM

## 2023-11-01 NOTE — Telephone Encounter (Signed)
 Can you please have her come tomorrow at 345.

## 2023-11-01 NOTE — Telephone Encounter (Signed)
 Please call and inform her that we have added her to the schedule.

## 2023-11-02 ENCOUNTER — Ambulatory Visit: Admitting: Neurology

## 2023-11-02 NOTE — Telephone Encounter (Signed)
 We can add her 4/23 at 345. Ok to double book.

## 2023-11-08 ENCOUNTER — Other Ambulatory Visit: Payer: Self-pay | Admitting: Endocrinology

## 2023-11-08 ENCOUNTER — Ambulatory Visit
Admission: RE | Admit: 2023-11-08 | Discharge: 2023-11-08 | Disposition: A | Discharge status: Home / Self Care | Source: Ambulatory Visit | Attending: Endocrinology | Admitting: Endocrinology

## 2023-11-08 ENCOUNTER — Ambulatory Visit: Admitting: Neurology

## 2023-11-08 DIAGNOSIS — R928 Other abnormal and inconclusive findings on diagnostic imaging of breast: Secondary | ICD-10-CM

## 2023-11-08 DIAGNOSIS — N6310 Unspecified lump in the right breast, unspecified quadrant: Secondary | ICD-10-CM

## 2023-11-09 ENCOUNTER — Ambulatory Visit
Admission: RE | Admit: 2023-11-09 | Discharge: 2023-11-09 | Discharge status: Home / Self Care | Source: Ambulatory Visit | Attending: Endocrinology | Admitting: Endocrinology

## 2023-11-09 ENCOUNTER — Ambulatory Visit
Admission: RE | Admit: 2023-11-09 | Discharge: 2023-11-09 | Disposition: A | Discharge status: Home / Self Care | Source: Ambulatory Visit | Attending: Endocrinology | Admitting: Endocrinology

## 2023-11-09 DIAGNOSIS — N6310 Unspecified lump in the right breast, unspecified quadrant: Secondary | ICD-10-CM

## 2023-11-09 DIAGNOSIS — R928 Other abnormal and inconclusive findings on diagnostic imaging of breast: Secondary | ICD-10-CM

## 2023-11-09 HISTORY — PX: BREAST BIOPSY: SHX20

## 2023-11-10 LAB — SURGICAL PATHOLOGY

## 2023-11-13 ENCOUNTER — Telehealth: Payer: Self-pay | Admitting: Neurology

## 2023-11-13 NOTE — Telephone Encounter (Signed)
 request to cancel appointment due to a medical emergency with brother

## 2023-11-14 ENCOUNTER — Ambulatory Visit: Admitting: Neurology

## 2024-01-26 ENCOUNTER — Ambulatory Visit: Admitting: Podiatry

## 2024-02-09 ENCOUNTER — Ambulatory Visit: Admitting: Podiatry

## 2024-02-09 ENCOUNTER — Encounter: Payer: Self-pay | Admitting: Podiatry

## 2024-02-09 DIAGNOSIS — M79674 Pain in right toe(s): Secondary | ICD-10-CM | POA: Diagnosis not present

## 2024-02-09 DIAGNOSIS — M79675 Pain in left toe(s): Secondary | ICD-10-CM

## 2024-02-09 DIAGNOSIS — B351 Tinea unguium: Secondary | ICD-10-CM

## 2024-02-09 DIAGNOSIS — E1142 Type 2 diabetes mellitus with diabetic polyneuropathy: Secondary | ICD-10-CM | POA: Diagnosis not present

## 2024-02-09 NOTE — Progress Notes (Signed)
  Subjective:  Patient ID: Connie Shelton, female    DOB: 11-21-1950,   MRN: [id number]  Chief Complaint  Patient presents with   Diabetes    It's my Diabetic foot check and she trims my nails.    73 y.o. female presents for concern of thickened elongated and painful nails that are difficult to trim. Requesting to have them trimmed today. Relates burning and tingling in their feet. Patient is diabetic and last A1c was 6.5.   PCP:  Nichole Senior, MD    . Denies any other pedal complaints. Denies n/v/f/c.   Past Medical History:  Diagnosis Date   Asthma    Cellulitis    Cholelithiasis    Diabetes mellitus (HCC)    Esophageal reflux    GI bleed    2003   History of liver biopsy    HTN (hypertension)    Hx of colonic polyps    2005/2008   Kidney stone    Obesity    Psoriatic arthritis (HCC)    Steatohepatitis     Objective:  Physical Exam: Vascular: DP/PT pulses 2/4 bilateral. CFT <3 seconds. Absent hair growth on digits. Edema noted to bilateral lower extremities. Xerosis noted bilaterally.  Skin. No lacerations or abrasions bilateral feet. Nails 1-5 bilateral  are thickened discolored and elongated with subungual debris. Hyperkeratotic lesion noted to plantar fourth digit.  Musculoskeletal: MMT 5/5 bilateral lower extremities in DF, PF, Inversion and Eversion. Deceased ROM in DF of ankle joint.  Neurological: Sensation intact to light touch. Protective sensation diminished bilateral.    Assessment:   1. Pain due to onychomycosis of toenails of both feet   2. Diabetic peripheral neuropathy associated with type 2 diabetes mellitus (HCC)        Plan:  Patient was evaluated and treated and all questions answered. -Discussed and educated patient on diabetic foot care, especially with  regards to the vascular, neurological and musculoskeletal systems.  -Stressed the importance of good glycemic control and the detriment of not  controlling glucose levels in relation to  the foot. -Discussed supportive shoes at all times and checking feet regularly.  -Mechanically debrided all nails 1-5 bilateral using sterile nail nipper and filed with dremel without incident  -Answered all patient questions -Patient to return  in 3 months for at risk foot care -Patient advised to call the office if any problems or questions arise in the meantime.   Connie Shelton, DPM

## 2024-05-07 ENCOUNTER — Ambulatory Visit: Admitting: Neurology

## 2024-05-09 ENCOUNTER — Ambulatory Visit: Admitting: Podiatry
